# Patient Record
Sex: Female | Born: 1970 | Race: White | Hispanic: No | Smoking: Never smoker
Health system: Southern US, Community
[De-identification: ages and names within clinical notes are randomized; demographics above are authoritative.]

## PROBLEM LIST (undated history)

## (undated) DIAGNOSIS — E119 Type 2 diabetes mellitus without complications: Secondary | ICD-10-CM

## (undated) DIAGNOSIS — K429 Umbilical hernia without obstruction or gangrene: Secondary | ICD-10-CM

## (undated) DIAGNOSIS — J301 Allergic rhinitis due to pollen: Secondary | ICD-10-CM

## (undated) DIAGNOSIS — J45909 Unspecified asthma, uncomplicated: Secondary | ICD-10-CM

## (undated) DIAGNOSIS — E785 Hyperlipidemia, unspecified: Secondary | ICD-10-CM

## (undated) DIAGNOSIS — I10 Essential (primary) hypertension: Secondary | ICD-10-CM

## (undated) DIAGNOSIS — J42 Unspecified chronic bronchitis: Secondary | ICD-10-CM

## (undated) HISTORY — DX: Allergic rhinitis due to pollen: J30.1

## (undated) HISTORY — DX: Unspecified chronic bronchitis: J42

## (undated) HISTORY — DX: Essential (primary) hypertension: I10

## (undated) HISTORY — DX: Type 2 diabetes mellitus without complications: E11.9

## (undated) HISTORY — DX: Unspecified asthma, uncomplicated: J45.909

---

## 2006-09-19 ENCOUNTER — Emergency Department: Payer: Self-pay | Admitting: Emergency Medicine

## 2006-10-02 ENCOUNTER — Emergency Department: Payer: Self-pay | Admitting: Emergency Medicine

## 2007-12-27 ENCOUNTER — Emergency Department: Payer: Self-pay | Admitting: Emergency Medicine

## 2008-03-23 ENCOUNTER — Emergency Department: Payer: Self-pay | Admitting: Emergency Medicine

## 2010-09-13 ENCOUNTER — Emergency Department: Payer: Self-pay | Admitting: Emergency Medicine

## 2013-01-23 ENCOUNTER — Emergency Department: Payer: Self-pay | Admitting: Emergency Medicine

## 2013-07-13 ENCOUNTER — Ambulatory Visit: Payer: Self-pay | Admitting: Family Medicine

## 2013-07-28 ENCOUNTER — Ambulatory Visit: Payer: Self-pay | Admitting: Family Medicine

## 2013-10-02 LAB — HM MAMMOGRAPHY: HM Mammogram: NORMAL

## 2013-11-02 LAB — HM PAP SMEAR: HM Pap smear: NORMAL

## 2014-01-02 LAB — HM DIABETES EYE EXAM

## 2014-10-03 ENCOUNTER — Ambulatory Visit (INDEPENDENT_AMBULATORY_CARE_PROVIDER_SITE_OTHER): Payer: BC Managed Care – PPO | Admitting: Nurse Practitioner

## 2014-10-03 ENCOUNTER — Encounter: Payer: Self-pay | Admitting: Nurse Practitioner

## 2014-10-03 VITALS — BP 130/88 | HR 89 | Temp 98.8°F | Resp 14 | Ht 61.5 in | Wt 207.4 lb

## 2014-10-03 DIAGNOSIS — Z7189 Other specified counseling: Secondary | ICD-10-CM | POA: Diagnosis not present

## 2014-10-03 DIAGNOSIS — J302 Other seasonal allergic rhinitis: Secondary | ICD-10-CM

## 2014-10-03 DIAGNOSIS — J4530 Mild persistent asthma, uncomplicated: Secondary | ICD-10-CM

## 2014-10-03 DIAGNOSIS — I10 Essential (primary) hypertension: Secondary | ICD-10-CM

## 2014-10-03 DIAGNOSIS — Z7689 Persons encountering health services in other specified circumstances: Secondary | ICD-10-CM

## 2014-10-03 DIAGNOSIS — E119 Type 2 diabetes mellitus without complications: Secondary | ICD-10-CM | POA: Diagnosis not present

## 2014-10-03 MED ORDER — CETIRIZINE HCL 10 MG PO CAPS: 1.0000 | ORAL_CAPSULE | Freq: Every day | ORAL | Status: DC

## 2014-10-03 MED ORDER — ALBUTEROL SULFATE HFA 108 (90 BASE) MCG/ACT IN AERS: 2.0000 | INHALATION_SPRAY | Freq: Four times a day (QID) | RESPIRATORY_TRACT | Status: DC | PRN

## 2014-10-03 MED ORDER — METFORMIN HCL ER 500 MG PO TB24: 500.0000 mg | ORAL_TABLET | Freq: Every day | ORAL | Status: DC

## 2014-10-03 MED ORDER — HYDROCHLOROTHIAZIDE 25 MG PO TABS: 25.0000 mg | ORAL_TABLET | Freq: Every day | ORAL | Status: DC

## 2014-10-03 NOTE — Progress Notes (Signed)
Subjective:    Patient ID: Whitney Palmer, female    DOB: 09-Nov-1970, 44 y.o.   MRN: [id number]  HPI  Whitney Palmer is a 44 yo female establishing care today and CC of asthma.   1) New pt info:   Immunizations- tdap 2014, unsure about pneumonia vaccination   Mammogram- 8/15  Pap- 8/15 Not keeping OB/GYN at [facility]   Eye Exam- 12/2013   Dental Exam- Not UTD   2) Chronic Problems-  Asthma- exacerbations in the summer, inhaler used in heat, around cats and cigarette smoke  Diabetes type II- Stopped eating bread, potatoes, soda ect... Lost 38 lbs    Diagnosed in 2014, gained it back since then. 500 mg metformin once daily  Chronic Bronchitis- had pneumonia in 2014, gets sick fast   Seasonal Allergies- Cats and cigarettes   Sleep study in 2013 or 2014- no OSA noted, pt reports large tonsils   3) Acute Problems-  Needs HCTZ Beginning of this year last labs    Review of Systems  Constitutional: Negative for fever, chills, diaphoresis and fatigue.  Respiratory: Negative for chest tightness, shortness of breath and wheezing.   Cardiovascular: Negative for chest pain, palpitations and leg swelling.  Gastrointestinal: Negative for nausea, vomiting and diarrhea.  Endocrine: Positive for polyphagia. Negative for polydipsia and polyuria.  Skin: Negative for rash.  Neurological: Negative for dizziness, weakness, numbness and headaches.  Psychiatric/Behavioral: The patient is not nervous/anxious.    Past Medical History  Diagnosis Date  . Asthma   . Diabetes mellitus without complication   . Chronic bronchitis   . Hay fever   . Hypertension     History   Social History  . Marital Status: Single    Spouse Name: N/A  . Number of Children: N/A  . Years of Education: N/A   Occupational History  . Not on file.   Social History Main Topics  . Smoking status: Never Smoker   . Smokeless tobacco: Not on file  . Alcohol Use: No  . Drug Use: No  . Sexual Activity:   Partners: Male     Comment: Fiance   Other Topics Concern  . 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 No pertinent past surgical history.  Family History  Problem Relation Age of Onset  . Hypertension Father     Not on File  No current outpatient prescriptions on file prior to visit.   No current facility-administered medications on file prior to visit.      Objective:   Physical Exam  Constitutional: Whitney Palmer is oriented to person, place, and time. Whitney Palmer appears well-developed and well-nourished. No distress.  BP 130/88 mmHg  Pulse 89  Temp(Src) 98.8 F (37.1 C)  Resp 14  Ht 5' 1.5" (1.562 m)  Wt 207 lb 6.4 oz (94.076 kg)  BMI 38.56 kg/m2  SpO2 97%   HENT:  Head: Normocephalic and atraumatic.  Right Ear: External ear normal.  Left Ear: External ear normal.  Cardiovascular: Normal rate, regular rhythm, normal heart sounds and intact distal pulses.  Exam reveals no gallop and no friction rub.   No murmur heard. Pulmonary/Chest: Effort normal and breath sounds normal. No respiratory distress.  Whitney Palmer has no wheezes. Whitney Palmer has no rales. Whitney Palmer exhibits no tenderness.  Neurological: Whitney Palmer is alert and oriented to person, place, and time. No cranial nerve deficit. Whitney Palmer exhibits normal muscle tone. Coordination normal.  Skin: Skin is warm and dry. No rash noted. Whitney Palmer is not diaphoretic.  Psychiatric: Whitney Palmer has a normal mood and affect. Her behavior is normal. Judgment and thought content normal.      Assessment & Plan:

## 2014-10-03 NOTE — Progress Notes (Signed)
Pre visit review using our clinic review tool, if applicable. No additional management support is needed unless otherwise documented below in the visit note. 

## 2014-10-03 NOTE — Patient Instructions (Addendum)
Follow up in 3 months for weight loss.

## 2014-10-13 DIAGNOSIS — I10 Essential (primary) hypertension: Secondary | ICD-10-CM | POA: Insufficient documentation

## 2014-10-13 DIAGNOSIS — J302 Other seasonal allergic rhinitis: Secondary | ICD-10-CM | POA: Insufficient documentation

## 2014-10-13 DIAGNOSIS — Z7689 Persons encountering health services in other specified circumstances: Secondary | ICD-10-CM | POA: Insufficient documentation

## 2014-10-13 DIAGNOSIS — E119 Type 2 diabetes mellitus without complications: Secondary | ICD-10-CM | POA: Insufficient documentation

## 2014-10-13 DIAGNOSIS — J45909 Unspecified asthma, uncomplicated: Secondary | ICD-10-CM | POA: Insufficient documentation

## 2014-10-13 NOTE — Assessment & Plan Note (Signed)
Stable. Pt report she does get exacerbations in the summer and bronchitis fairly easily. Stable on albuterol.

## 2014-10-13 NOTE — Assessment & Plan Note (Signed)
Will obtain records for latest labs.  BP Readings from Last 3 Encounters:  10/03/14 130/88   Will refill HCTZ today.

## 2014-10-13 NOTE — Assessment & Plan Note (Signed)
Discussed acute and chronic issues. Reviewed health maintenance measures, PFSHx, and immunizations. Obtain records from previous facility.   

## 2014-10-13 NOTE — Assessment & Plan Note (Signed)
Stable. Refilled metformin. Pt gained back weight she lost previously and is taking metformin 500 mg once daily XR tablet. She had labs at the beginning of this year. Will obtain records.

## 2014-11-02 ENCOUNTER — Encounter: Payer: Self-pay | Admitting: Nurse Practitioner

## 2014-11-14 ENCOUNTER — Encounter: Payer: Self-pay | Admitting: Nurse Practitioner

## 2014-11-14 ENCOUNTER — Ambulatory Visit (INDEPENDENT_AMBULATORY_CARE_PROVIDER_SITE_OTHER): Payer: BC Managed Care – PPO | Admitting: Nurse Practitioner

## 2014-11-14 VITALS — BP 130/81 | HR 71 | Temp 98.6°F | Resp 14 | Ht 65.5 in | Wt 208.2 lb

## 2014-11-14 DIAGNOSIS — J029 Acute pharyngitis, unspecified: Secondary | ICD-10-CM | POA: Insufficient documentation

## 2014-11-14 LAB — POCT RAPID STREP A (OFFICE): RAPID STREP A SCREEN: NEGATIVE

## 2014-11-14 MED ORDER — AZITHROMYCIN 250 MG PO TABS: ORAL_TABLET | ORAL | Status: DC

## 2014-11-14 NOTE — Progress Notes (Signed)
Pre visit review using our clinic review tool, if applicable. No additional management support is needed unless otherwise documented below in the visit note. 

## 2014-11-14 NOTE — Assessment & Plan Note (Signed)
Worsening 1 day. Patient was advised to try conservative therapy at home first. Patient reports she will try cold and flu medication first. Patient encouraged to drink non-caffeinated fluids. Z-Pak sent to pharmacy and encourage patient to wait until fever of 100.5 or worsening sore throat. Probiotics encouraged if patient decides to take Z-Pak. FU prn worsening/failure to improve.

## 2014-11-14 NOTE — Progress Notes (Signed)
Patient ID: Whitney Palmer, female    DOB: 10-30-1970  Age: 44 y.o. MRN: [id number]  CC: Sore Throat   HPI Tylasia Fletchall Tall presents for sore throat x 1 day.  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 Ear on right is also bothering her. Patient reports last time she had the symptoms have progressed quickly and she was out for 6 weeks.   History Whitney Palmer has a past medical history of Asthma; Diabetes mellitus without complication; Chronic bronchitis; Hay fever; and Hypertension.   She has no past surgical history on file.   Her family history includes Hypertension in her father.She reports that she has never smoked. She does not have any smokeless tobacco history on file. She reports that she does not drink alcohol or use illicit drugs.  Outpatient Prescriptions Prior to Visit  Medication Sig Dispense Refill  . albuterol (PROVENTIL HFA;VENTOLIN HFA) 108 (90 BASE) MCG/ACT inhaler Inhale 2 puffs into the lungs every 6 (six) hours as needed for wheezing or shortness of breath. 1 Inhaler 2  . Cetirizine HCl 10 MG CAPS Take 1 capsule (10 mg total) by mouth daily. 90 capsule 2  . hydrochlorothiazide (HYDRODIURIL) 25 MG tablet Take 1 tablet (25 mg total) by mouth daily. 90 tablet 2  . metFORMIN (GLUCOPHAGE-XR) 500 MG 24 hr tablet Take 1 tablet (500 mg total) by mouth daily. 30 tablet 0   No facility-administered medications prior to visit.    ROS Review of Systems  Constitutional: Negative for fever, chills, diaphoresis and fatigue.  HENT: Positive for sore throat. Negative for congestion, dental problem, drooling, ear discharge, ear pain, facial swelling, hearing loss, mouth sores, nosebleeds, postnasal drip, rhinorrhea, sinus pressure, sneezing, tinnitus, trouble swallowing and voice change.   Respiratory: Negative for chest tightness, shortness of breath and wheezing.    Cardiovascular: Negative for chest pain, palpitations and leg swelling.  Gastrointestinal: Negative for nausea, vomiting and diarrhea.  Skin: Negative for rash.  Neurological: Negative for dizziness, weakness, numbness and headaches.  Psychiatric/Behavioral: The patient is not nervous/anxious.     Objective:  BP 130/81 mmHg  Pulse 71  Temp(Src) 98.6 F (37 C)  Resp 14  Ht 5' 5.5" (1.664 m)  Wt 208 lb 3.2 oz (94.439 kg)  BMI 34.11 kg/m2  SpO2 98%  Physical Exam  Constitutional: She is oriented to person, place, and time. She appears well-developed and well-nourished. No distress.  HENT:  Head: Normocephalic and atraumatic.  Right Ear: External ear normal.  Left Ear: External ear normal.  Tympanic membrane clear on right and blocked by cerumen on left  Eyes: EOM are normal. Pupils are equal, round, and reactive to light. Right eye exhibits no discharge. Left eye exhibits no discharge. No scleral icterus.  Neck: Normal range of motion. Neck supple.  Cardiovascular: Normal rate, regular rhythm and normal heart sounds.  Exam reveals no gallop and no friction rub.   No murmur heard. Pulmonary/Chest: Effort normal and breath sounds normal. No respiratory distress. She has no wheezes. She has no rales. She exhibits no tenderness.  Lymphadenopathy:    She has cervical adenopathy.  Neurological: She is alert and oriented to person, place, and time. No cranial nerve deficit. She exhibits normal muscle tone. Coordination normal.  Skin: Skin is warm and dry. No rash noted. She is not diaphoretic.  Psychiatric: She has a normal mood and affect. Her  behavior is normal. Judgment and thought content normal.    Assessment & Plan:   Denis was seen today for sore throat.  Diagnoses and all orders for this visit:  Sore throat -     POCT rapid strep A  Other orders -     azithromycin (ZITHROMAX) 250 MG tablet; Take 2 tablets by mouth on day 1, take 1 tablet by mouth each day after for 4  days.   I am having [name] start on azithromycin. I am also having her maintain her metFORMIN, hydrochlorothiazide, Cetirizine HCl, and albuterol.  Meds ordered this encounter  Medications  . azithromycin (ZITHROMAX) 250 MG tablet    Sig: Take 2 tablets by mouth on day 1, take 1 tablet by mouth each day after for 4 days.    Dispense:  6 each    Refill:  0    Order Specific Question:  Supervising Provider    Answer:  Sherlene Shams [[id number]]     Follow-up: Return if symptoms worsen or fail to improve.

## 2014-11-16 ENCOUNTER — Encounter: Payer: Self-pay | Admitting: Nurse Practitioner

## 2014-12-06 ENCOUNTER — Encounter: Payer: Self-pay | Admitting: Nurse Practitioner

## 2015-01-03 ENCOUNTER — Ambulatory Visit: Payer: BC Managed Care – PPO | Admitting: Nurse Practitioner

## 2015-01-28 ENCOUNTER — Ambulatory Visit (INDEPENDENT_AMBULATORY_CARE_PROVIDER_SITE_OTHER): Payer: BC Managed Care – PPO | Admitting: Nurse Practitioner

## 2015-01-28 ENCOUNTER — Encounter: Payer: Self-pay | Admitting: Nurse Practitioner

## 2015-01-28 VITALS — BP 110/78 | HR 85 | Temp 98.3°F | Resp 14 | Ht 66.0 in | Wt 200.4 lb

## 2015-01-28 DIAGNOSIS — E119 Type 2 diabetes mellitus without complications: Secondary | ICD-10-CM | POA: Diagnosis not present

## 2015-01-28 DIAGNOSIS — J4530 Mild persistent asthma, uncomplicated: Secondary | ICD-10-CM

## 2015-01-28 LAB — CBC WITH DIFFERENTIAL/PLATELET
BASOS ABS: 0 10*3/uL (ref 0.0–0.1)
Basophils Relative: 0.6 % (ref 0.0–3.0)
EOS ABS: 0.1 10*3/uL (ref 0.0–0.7)
Eosinophils Relative: 1.8 % (ref 0.0–5.0)
HCT: 42.9 % (ref 36.0–46.0)
Hemoglobin: 14.6 g/dL (ref 12.0–15.0)
LYMPHS ABS: 2.1 10*3/uL (ref 0.7–4.0)
Lymphocytes Relative: 40 % (ref 12.0–46.0)
MCHC: 33.9 g/dL (ref 30.0–36.0)
MCV: 87.5 fl (ref 78.0–100.0)
MONOS PCT: 5.3 % (ref 3.0–12.0)
Monocytes Absolute: 0.3 10*3/uL (ref 0.1–1.0)
NEUTROS ABS: 2.8 10*3/uL (ref 1.4–7.7)
NEUTROS PCT: 52.3 % (ref 43.0–77.0)
PLATELETS: 282 10*3/uL (ref 150.0–400.0)
RBC: 4.9 Mil/uL (ref 3.87–5.11)
RDW: 12.1 % (ref 11.5–15.5)
WBC: 5.3 10*3/uL (ref 4.0–10.5)

## 2015-01-28 LAB — COMPREHENSIVE METABOLIC PANEL
ALK PHOS: 62 U/L (ref 39–117)
ALT: 32 U/L (ref 0–35)
AST: 19 U/L (ref 0–37)
Albumin: 4.1 g/dL (ref 3.5–5.2)
BILIRUBIN TOTAL: 0.6 mg/dL (ref 0.2–1.2)
BUN: 13 mg/dL (ref 6–23)
CO2: 28 meq/L (ref 19–32)
CREATININE: 0.63 mg/dL (ref 0.40–1.20)
Calcium: 9.6 mg/dL (ref 8.4–10.5)
Chloride: 100 mEq/L (ref 96–112)
GFR: 109.77 mL/min (ref >60.00)
GLUCOSE: 162 mg/dL — AB (ref 70–99)
Potassium: 4.2 mEq/L (ref 3.5–5.1)
SODIUM: 137 meq/L (ref 135–145)
TOTAL PROTEIN: 7.2 g/dL (ref 6.0–8.3)

## 2015-01-28 LAB — LIPID PANEL
Cholesterol: 210 mg/dL — ABNORMAL HIGH (ref 0–200)
HDL: 34.9 mg/dL — AB (ref >39.00)
NONHDL: 175.35
Total CHOL/HDL Ratio: 6
Triglycerides: 243 mg/dL — ABNORMAL HIGH (ref 0.0–149.0)
VLDL: 48.6 mg/dL — ABNORMAL HIGH (ref 0.0–40.0)

## 2015-01-28 LAB — HEMOGLOBIN A1C: HEMOGLOBIN A1C: 7.1 % — AB (ref 4.6–6.5)

## 2015-01-28 LAB — LDL CHOLESTEROL, DIRECT: LDL DIRECT: 143 mg/dL

## 2015-01-28 MED ORDER — METFORMIN HCL 500 MG PO TABS: 1000.0000 mg | ORAL_TABLET | Freq: Two times a day (BID) | ORAL | Status: DC

## 2015-01-28 MED ORDER — ALBUTEROL SULFATE HFA 108 (90 BASE) MCG/ACT IN AERS: 2.0000 | INHALATION_SPRAY | Freq: Four times a day (QID) | RESPIRATORY_TRACT | Status: AC | PRN

## 2015-01-28 NOTE — Assessment & Plan Note (Signed)
Stable. Pt lost 8 lbs and making diet and exercise changes. I have congratulated her on this. Changed Metformin to twice daily instead of XL version 500 mg. Pt instructed on taking 1 w/ breakfast and 1 with dinner.   Pt fasting today will obtain labs.  Foot exam completed Eye exam upcoming  Flu vaccine through work  Pneumovax today

## 2015-01-28 NOTE — Assessment & Plan Note (Signed)
Albuterol inhaler sent to pharmacy. Pt not wheezing today. Recently completed z-pack through walk-in clinic.

## 2015-01-28 NOTE — Patient Instructions (Addendum)
We will send results via MyChart.   Thank you for getting your pneumonia, flu vaccines, and eye exams completed this month!   Take 1 metformin w/ breakfast and the other with dinner.   Follow up in 2 months. Keep up the great work!

## 2015-01-28 NOTE — Progress Notes (Signed)
Pre visit review using our clinic review tool, if applicable. No additional management support is needed unless otherwise documented below in the visit note. 

## 2015-01-28 NOTE — Progress Notes (Signed)
Patient ID: Whitney Palmer, female    DOB: 08-06-1970  Age: 44 y.o. MRN: [id number]  CC: Follow-up   HPI Whitney Palmer presents for follow up on diabetes and needs refills.   1) Needs refill on Metformin and Albuterol inhaler.  Diet- cutting down on carbs and on sweets. 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  History [name] has a past medical history of Asthma; Diabetes mellitus without complication (HCC); Chronic bronchitis (HCC); Hay fever; and Hypertension.   She has no past surgical history on file.   Her family history includes Hypertension in her father.She reports that she has never smoked. She does not have any smokeless tobacco history on file. She reports that she does not drink alcohol or use illicit drugs.  Outpatient Prescriptions Prior to Visit  Medication Sig Dispense Refill  . Cetirizine HCl 10 MG CAPS Take 1 capsule (10 mg total) by mouth daily. 90 capsule 2  . hydrochlorothiazide (HYDRODIURIL) 25 MG tablet Take 1 tablet (25 mg total) by mouth daily. 90 tablet 2  . albuterol (PROVENTIL HFA;VENTOLIN HFA) 108 (90 BASE) MCG/ACT inhaler Inhale 2 puffs into the lungs every 6 (six) hours as needed for wheezing or shortness of breath. 1 Inhaler 2  . azithromycin (ZITHROMAX) 250 MG tablet Take 2 tablets by mouth on day 1, take 1 tablet by mouth each day after for 4 days. 6 each 0  . metFORMIN (GLUCOPHAGE-XR) 500 MG 24 hr tablet Take 1 tablet (500 mg total) by mouth daily. 30 tablet 0   No facility-administered medications prior to visit.    ROS Review of Systems  Constitutional: Negative for fever, chills, diaphoresis and fatigue.  HENT: Negative for sore throat, tinnitus and trouble swallowing.   Eyes: Negative for visual disturbance.  Respiratory: Negative for chest  tightness, shortness of breath and wheezing.   Cardiovascular: Negative for chest pain, palpitations and leg swelling.  Gastrointestinal: Negative for nausea, vomiting and diarrhea.  Endocrine: Positive for polyphagia. Negative for polydipsia and polyuria.       Feeling hungry often  Skin: Negative for rash.  Neurological: Negative for dizziness, weakness, numbness and headaches.  Psychiatric/Behavioral: The patient is not nervous/anxious.     Objective:  BP 110/78 mmHg  Pulse 85  Temp(Src) 98.3 F (36.8 C)  Resp 14  Ht  (1.676 m)  Wt 200 lb 6.4 oz (90.901 kg)  BMI 32.36 kg/m2  SpO2 95%  LMP 01/14/2015  Physical Exam  Constitutional: She is oriented to person, place, and time. She appears well-developed and well-nourished. No distress.  HENT:  Head: Normocephalic and atraumatic.  Right Ear: External ear normal.  Left Ear: External ear normal.  Eyes: EOM are normal. Pupils are equal, round, and reactive to light. Right eye exhibits no discharge. Left eye exhibits no discharge. No scleral icterus.  Cardiovascular: Normal rate, regular rhythm, normal heart sounds and intact distal pulses.  Exam reveals no gallop and no friction rub.   No murmur heard. Pulmonary/Chest: Effort normal and breath sounds normal. No respiratory distress. She has no wheezes. She has no rales. She exhibits no tenderness.  Neurological: She is alert and oriented to person, place, and time. No cranial nerve deficit. She exhibits normal muscle tone. Coordination normal.  Monofilament normal  Skin: Skin is  warm and dry. No rash noted. She is not diaphoretic.  Psychiatric: She has a normal mood and affect. Her behavior is normal. Judgment and thought content normal.   Assessment & Plan:   Whitney Palmer was seen today for follow-up.  Diagnoses and all orders for this visit:  Type 2 diabetes mellitus without complication, without long-term current use of insulin (HCC) -     Hemoglobin A1c -     CBC with  Differential/Platelet -     Comprehensive metabolic panel -     Microalbumin, urine -     Lipid Profile  Asthma, chronic, mild persistent, uncomplicated  Other orders -     albuterol (PROVENTIL HFA;VENTOLIN HFA) 108 (90 BASE) MCG/ACT inhaler; Inhale 2 puffs into the lungs every 6 (six) hours as needed for wheezing or shortness of breath. -     metFORMIN (GLUCOPHAGE) 500 MG tablet; Take 2 tablets (1,000 mg total) by mouth 2 (two) times daily with a meal.   I have discontinued Ms. Finfrock's metFORMIN and azithromycin. I am also having her start on metFORMIN. Additionally, I am having her maintain her hydrochlorothiazide, Cetirizine HCl, and albuterol.  Meds ordered this encounter  Medications  . albuterol (PROVENTIL HFA;VENTOLIN HFA) 108 (90 BASE) MCG/ACT inhaler    Sig: Inhale 2 puffs into the lungs every 6 (six) hours as needed for wheezing or shortness of breath.    Dispense:  1 Inhaler    Refill:  2    Order Specific Question:  Supervising Provider    Answer:  Darrick HuntsmanULLO, TERESA L [[id number]]  . metFORMIN (GLUCOPHAGE) 500 MG tablet    Sig: Take 2 tablets (1,000 mg total) by mouth 2 (two) times daily with a meal.    Dispense:  60 tablet    Refill:  1    Order Specific Question:  Supervising Provider    Answer:  Sherlene ShamsULLO, TERESA L [[id number]]     Follow-up: Return in about 2 months (around 03/30/2015) for DM and asthma check .

## 2015-01-29 ENCOUNTER — Other Ambulatory Visit: Payer: Self-pay | Admitting: Nurse Practitioner

## 2015-01-29 ENCOUNTER — Encounter: Payer: Self-pay | Admitting: Nurse Practitioner

## 2015-01-29 LAB — MICROALBUMIN, URINE: Microalb, Ur: 3.5 mg/dL

## 2015-01-29 MED ORDER — SIMVASTATIN 20 MG PO TABS: 20.0000 mg | ORAL_TABLET | Freq: Every day | ORAL | Status: DC

## 2015-01-31 ENCOUNTER — Other Ambulatory Visit: Payer: Self-pay | Admitting: Nurse Practitioner

## 2015-01-31 ENCOUNTER — Encounter: Payer: Self-pay | Admitting: Nurse Practitioner

## 2015-01-31 ENCOUNTER — Other Ambulatory Visit: Payer: Self-pay

## 2015-01-31 MED ORDER — GLUCOSE BLOOD VI STRP: ORAL_STRIP | Status: DC

## 2015-03-04 ENCOUNTER — Encounter: Payer: Self-pay | Admitting: Nurse Practitioner

## 2015-03-17 ENCOUNTER — Encounter: Payer: Self-pay | Admitting: Nurse Practitioner

## 2015-03-22 ENCOUNTER — Ambulatory Visit (INDEPENDENT_AMBULATORY_CARE_PROVIDER_SITE_OTHER): Payer: BC Managed Care – PPO | Admitting: Nurse Practitioner

## 2015-03-22 ENCOUNTER — Encounter: Payer: Self-pay | Admitting: Nurse Practitioner

## 2015-03-22 VITALS — BP 114/72 | HR 96 | Temp 98.7°F | Ht 60.0 in | Wt 201.0 lb

## 2015-03-22 DIAGNOSIS — J029 Acute pharyngitis, unspecified: Secondary | ICD-10-CM | POA: Diagnosis not present

## 2015-03-22 LAB — POCT RAPID STREP A (OFFICE): RAPID STREP A SCREEN: NEGATIVE — AB

## 2015-03-22 MED ORDER — AMOXICILLIN-POT CLAVULANATE 875-125 MG PO TABS: 1.0000 | ORAL_TABLET | Freq: Two times a day (BID) | ORAL | Status: DC

## 2015-03-22 MED ORDER — CARBAMIDE PEROXIDE 6.5 % OT SOLN: 5.0000 [drp] | Freq: Two times a day (BID) | OTIC | Status: DC

## 2015-03-22 NOTE — Patient Instructions (Signed)
Take generic OTC benadryl 25 mg  At bedtime for the drainage,,  Delsym for daytime cough. flush your sinuses twice daily with Simply Saline   If the coughing has not impoved in  48 hours  OR  if you develop T > 100.4,  Green nasal discharge,  Or facial pain, start the antibiotic.  Please take a probiotic ( Align, Floraque or Culturelle) while you are on the antibiotic to prevent a serious antibiotic associated diarrhea  Called clostridium dificile colitis and a vaginal yeast infection

## 2015-03-22 NOTE — Progress Notes (Signed)
Patient ID: Whitney Palmer, female    DOB: 28-Apr-1970  Age: 44 y.o. MRN: [id number]  CC: Sore Throat   HPI Whitney Palmer presents for CC of sore throat x 2 days.   1) Swollen glands, ears feels stuffy, light green rhinorrhea  Does not want this to progress fully over the holiday.  Tmax- 100.3 Treatment to date: Advil, garggled with warm salt water Advil cold and sinus Cold Eez  Sick contacts- children    History Whitney Palmer has a past medical history of Asthma; Diabetes mellitus without complication (HCC); Chronic bronchitis (HCC); Hay fever; and Hypertension.   Whitney Palmer has no past surgical history on file.   Her family history includes Hypertension in her father.Whitney Palmer reports that Whitney Palmer has never smoked. Whitney Palmer does not have any smokeless tobacco history on file. Whitney Palmer reports that Whitney Palmer does not drink alcohol or use illicit drugs.  Outpatient Prescriptions Prior to Visit  Medication Sig Dispense Refill  . albuterol (PROVENTIL HFA;VENTOLIN HFA) 108 (90 BASE) MCG/ACT inhaler Inhale 2 puffs into the lungs every 6 (six) hours as needed for wheezing or shortness of breath. 1 Inhaler 2  . Cetirizine HCl 10 MG CAPS Take 1 capsule (10 mg total) by mouth daily. 90 capsule 2  . glucose blood test strip Use as instructed 50 each 11  . hydrochlorothiazide (HYDRODIURIL) 25 MG tablet Take 1 tablet (25 mg total) by mouth daily. 90 tablet 2  . metFORMIN (GLUCOPHAGE) 500 MG tablet Take 2 tablets (1,000 mg total) by mouth 2 (two) times daily with a meal. 60 tablet 1  . simvastatin (ZOCOR) 20 MG tablet Take 1 tablet (20 mg total) by mouth at bedtime. 30 tablet 1   No facility-administered medications prior to visit.    ROS Review of Systems  Constitutional: Negative for fever, chills, diaphoresis and fatigue.  Respiratory: Negative for chest tightness, shortness of breath and wheezing.   Cardiovascular: Negative for chest pain, palpitations and leg swelling.  Gastrointestinal: Negative for nausea, vomiting  and diarrhea.  Skin: Negative for rash.  Neurological: Negative for dizziness, weakness, numbness and headaches.  Psychiatric/Behavioral: The patient is not nervous/anxious.     Objective:  BP 114/72 mmHg  Pulse 96  Temp(Src) 98.7 F (37.1 C)  Ht 5' (1.524 m)  Wt 201 lb (91.173 kg)  BMI 39.26 kg/m2  LMP 03/22/2015  Physical Exam  Constitutional: Whitney Palmer is oriented to person, place, and time. Whitney Palmer appears well-developed and well-nourished. No distress.  HENT:  Head: Normocephalic and atraumatic.  Right Ear: External ear normal.  Left Ear: External ear normal.  Large tonsils, no exudates, no erythema, clear drainage TM's clear on right, blocked by cerumen on left  Eyes: EOM are normal. Pupils are equal, round, and reactive to light. Right eye exhibits no discharge. Left eye exhibits no discharge. No scleral icterus.  Neck: Normal range of motion. Neck supple.  Cardiovascular: Normal rate, regular rhythm and normal heart sounds.  Exam reveals no gallop and no friction rub.   No murmur heard. Pulmonary/Chest: Effort normal and breath sounds normal. No respiratory distress. Whitney Palmer has no wheezes. Whitney Palmer has no rales. Whitney Palmer exhibits no tenderness.  Lymphadenopathy:    Whitney Palmer has cervical adenopathy.  Neurological: Whitney Palmer is alert and oriented to person, place, and time. No cranial nerve deficit. Whitney Palmer exhibits normal muscle tone. Coordination normal.  Skin: Skin is warm and dry. No rash noted. Whitney Palmer is not diaphoretic.  Psychiatric: Whitney Palmer has a normal mood and affect. Her behavior is normal. Judgment and  thought content normal.   Assessment & Plan:   Whitney Palmer was seen today for sore throat.  Diagnoses and all orders for this visit:  Sore throat -     POCT rapid strep A  Other orders -     amoxicillin-clavulanate (AUGMENTIN) 875-125 MG tablet; Take 1 tablet by mouth 2 (two) times daily. -     carbamide peroxide (DEBROX) 6.5 % otic solution; Place 5 drops into both ears 2 (two) times daily.   I am  having Whitney Palmer start on amoxicillin-clavulanate and carbamide peroxide. I am also having her maintain her hydrochlorothiazide, Cetirizine HCl, albuterol, metFORMIN, simvastatin, and glucose blood.  Meds ordered this encounter  Medications  . amoxicillin-clavulanate (AUGMENTIN) 875-125 MG tablet    Sig: Take 1 tablet by mouth 2 (two) times daily.    Dispense:  14 tablet    Refill:  0    Order Specific Question:  Supervising Provider    Answer:  Duncan DullULLO, TERESA L [[id number]]  . carbamide peroxide (DEBROX) 6.5 % otic solution    Sig: Place 5 drops into both ears 2 (two) times daily.    Dispense:  15 mL    Refill:  0    Order Specific Question:  Supervising Provider    Answer:  Sherlene ShamsULLO, TERESA L [[id number]]     Follow-up: Return if symptoms worsen or fail to improve.

## 2015-03-22 NOTE — Assessment & Plan Note (Signed)
POCT rapid strep negative. Debrox recommended for removal of wax in left ear. Augmentin sent to pharmacy to pick up. Asked pt not to take until fever of 100.4, facial pain, sore throat progresses despite otc measures.

## 2015-03-25 ENCOUNTER — Encounter: Payer: Self-pay | Admitting: Nurse Practitioner

## 2015-07-10 ENCOUNTER — Encounter: Payer: Self-pay | Admitting: Nurse Practitioner

## 2015-07-11 ENCOUNTER — Other Ambulatory Visit: Payer: Self-pay | Admitting: Nurse Practitioner

## 2015-07-11 ENCOUNTER — Encounter: Payer: Self-pay | Admitting: Nurse Practitioner

## 2015-07-11 MED ORDER — PREDNISONE 10 MG PO TABS: ORAL_TABLET | ORAL | Status: DC

## 2015-07-23 ENCOUNTER — Encounter: Payer: Self-pay | Admitting: Nurse Practitioner

## 2015-09-17 ENCOUNTER — Encounter: Payer: Self-pay | Admitting: Internal Medicine

## 2015-09-17 ENCOUNTER — Ambulatory Visit (INDEPENDENT_AMBULATORY_CARE_PROVIDER_SITE_OTHER): Payer: BC Managed Care – PPO | Admitting: Internal Medicine

## 2015-09-17 VITALS — BP 122/80 | HR 82 | Temp 98.8°F | Ht 60.0 in | Wt 196.0 lb

## 2015-09-17 DIAGNOSIS — I10 Essential (primary) hypertension: Secondary | ICD-10-CM

## 2015-09-17 DIAGNOSIS — E785 Hyperlipidemia, unspecified: Secondary | ICD-10-CM | POA: Insufficient documentation

## 2015-09-17 DIAGNOSIS — J4520 Mild intermittent asthma, uncomplicated: Secondary | ICD-10-CM

## 2015-09-17 DIAGNOSIS — E119 Type 2 diabetes mellitus without complications: Secondary | ICD-10-CM

## 2015-09-17 LAB — CBC
HEMATOCRIT: 41.7 % (ref 36.0–46.0)
HEMOGLOBIN: 14.3 g/dL (ref 12.0–15.0)
MCHC: 34.3 g/dL (ref 30.0–36.0)
MCV: 86.9 fl (ref 78.0–100.0)
PLATELETS: 299 10*3/uL (ref 150.0–400.0)
RBC: 4.79 Mil/uL (ref 3.87–5.11)
RDW: 12.9 % (ref 11.5–15.5)
WBC: 7.1 10*3/uL (ref 4.0–10.5)

## 2015-09-17 LAB — LDL CHOLESTEROL, DIRECT: Direct LDL: 118 mg/dL

## 2015-09-17 LAB — COMPREHENSIVE METABOLIC PANEL
ALT: 30 U/L (ref 0–35)
AST: 17 U/L (ref 0–37)
Albumin: 4 g/dL (ref 3.5–5.2)
Alkaline Phosphatase: 65 U/L (ref 39–117)
BUN: 11 mg/dL (ref 6–23)
CALCIUM: 9.8 mg/dL (ref 8.4–10.5)
CHLORIDE: 99 meq/L (ref 96–112)
CO2: 30 meq/L (ref 19–32)
Creatinine, Ser: 0.62 mg/dL (ref 0.40–1.20)
GFR: 111.48 mL/min (ref >60.00)
Glucose, Bld: 195 mg/dL — ABNORMAL HIGH (ref 70–99)
POTASSIUM: 4.1 meq/L (ref 3.5–5.1)
Sodium: 134 mEq/L — ABNORMAL LOW (ref 135–145)
Total Bilirubin: 0.5 mg/dL (ref 0.2–1.2)
Total Protein: 7.2 g/dL (ref 6.0–8.3)

## 2015-09-17 LAB — MICROALBUMIN / CREATININE URINE RATIO
Creatinine,U: 149.1 mg/dL
MICROALB UR: 2.1 mg/dL — AB (ref 0.0–1.9)
Microalb Creat Ratio: 1.4 mg/g (ref 0.0–30.0)

## 2015-09-17 LAB — LIPID PANEL
Cholesterol: 225 mg/dL — ABNORMAL HIGH (ref 0–200)
HDL: 34.5 mg/dL — AB (ref >39.00)
Total CHOL/HDL Ratio: 7

## 2015-09-17 LAB — HEMOGLOBIN A1C: HEMOGLOBIN A1C: 7.9 % — AB (ref 4.6–6.5)

## 2015-09-17 NOTE — Assessment & Plan Note (Signed)
Continue Zyrtec and Albuterol as needed ?

## 2015-09-17 NOTE — Progress Notes (Signed)
HPI  Pt presents to the clinic today to establish care and for management of the conditions listed below. She is transferring care from [facility].  Asthma: Allergy induced. Worse in the spring. She takes Zyrtec and Albuterol only as needed.  DM 2: Her sugars run 100-120 fasting. She stopped taking the Metformin 07/2015, because she just wanted to stop taking it. She gets a yearly eye exam. Her flu and pneumonia vaccine are UTD.  HTN: She is taking the HCTZ as prescribed. Her BP today is 122/80. There is no ECG on file.  HLD: She reports she stopped taking the Simvastatin after 1 month. She is trying to consume a low fat diet.  Flu: 01/2015 Tetanus: 09/2012 Pneumovax: 12/2014 Pap Smear: 10/2013 Mammogram: 09/2013 Vision Screening: yearly Dentist: biannually   Past Medical History  Diagnosis Date  . Asthma   . Diabetes mellitus without complication (HCC)   . Chronic bronchitis (HCC)   . Hay fever   . Hypertension     Current Outpatient Prescriptions  Medication Sig Dispense Refill  . albuterol (PROVENTIL HFA;VENTOLIN HFA) 108 (90 BASE) MCG/ACT inhaler Inhale 2 puffs into the lungs every 6 (six) hours as needed for wheezing or shortness of breath. 1 Inhaler 2  . carbamide peroxide (DEBROX) 6.5 % otic solution Place 5 drops into both ears 2 (two) times daily. 15 mL 0  . Cetirizine HCl 10 MG CAPS Take 1 capsule (10 mg total) by mouth daily. 90 capsule 2  . glucose blood test strip Use as instructed 50 each 11  . hydrochlorothiazide (HYDRODIURIL) 25 MG tablet Take 1 tablet (25 mg total) by mouth daily. 90 tablet 2  . metFORMIN (GLUCOPHAGE) 500 MG tablet Take 2 tablets (1,000 mg total) by mouth 2 (two) times daily with a meal. 60 tablet 1  . simvastatin (ZOCOR) 20 MG tablet Take 1 tablet (20 mg total) by mouth at bedtime. (Patient not taking: Reported on 09/17/2015) 30 tablet 1   No current facility-administered medications for this visit.    No Known Allergies  Family History   Problem Relation Age of Onset  . Hypertension Father     Social History   Social History  . Marital Status: Single    Spouse Name: N/A  . Number of Children: N/A  . Years of Education: N/A   Occupational History  . Not on file.   Social History Main Topics  . Smoking status: Never Smoker   . Smokeless tobacco: Not on file  . Alcohol Use: No  . Drug Use: No  . Sexual Activity:    Partners: Male     Comment: Fiance   Other Topics Concern  . 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  HEENT: Denies eye pain, eye redness, ear pain, ringing in the ears, wax buildup, runny nose, nasal congestion, bloody nose, or sore throat. Respiratory: Denies difficulty breathing, shortness of breath, cough or sputum production.   Cardiovascular: Denies chest pain, chest tightness, palpitations or swelling in the hands or feet.  Neurological: Denies dizziness, difficulty with memory, difficulty with speech or problems with balance and coordination.  Psych: Denies anxiety,  depression, SI/HI.  No other specific complaints in a complete review of systems (except as listed in HPI above).  PE:  BP 122/80 mmHg  Pulse 82  Temp(Src) 98.8 F (37.1 C) (Oral)  Ht 5' (1.524 m)  Wt 196 lb (88.905 kg)  BMI 38.28 kg/m2  SpO2 98%  LMP 09/11/2015 Wt Readings from Last 3 Encounters:  09/17/15 196 lb (88.905 kg)  03/22/15 201 lb (91.173 kg)  01/28/15 200 lb 6.4 oz (90.901 kg)    General: Appears her stated age, obese in NAD. HEENT: Head: normal shape and size; Eyes: sclera white, no icterus, conjunctiva pink, PERRLA and EOMs intact; Throat/Mouth: Teeth present, mucosa pink and moist, no lesions or ulcerations  noted.  Cardiovascular: Normal rate and rhythm. S1,S2 noted.  No murmur, rubs or gallops noted. No JVD or BLE edema.  Pulmonary/Chest: Normal effort and positive vesicular breath sounds. No respiratory distress. No wheezes, rales or ronchi noted.  Neurological: Alert and oriented.  Psychiatric: She is mildly anxious today.  BMET    Component Value Date/Time   NA 137 01/28/2015 0836   K 4.2 01/28/2015 0836   CL 100 01/28/2015 0836   CO2 28 01/28/2015 0836   GLUCOSE 162* 01/28/2015 0836   BUN 13 01/28/2015 0836   CREATININE 0.63 01/28/2015 0836   CALCIUM 9.6 01/28/2015 0836    Lipid Panel     Component Value Date/Time   CHOL 210* 01/28/2015 0836   TRIG 243.0* 01/28/2015 0836   HDL 34.90* 01/28/2015 0836   CHOLHDL 6 01/28/2015 0836   VLDL 48.6* 01/28/2015 0836    CBC    Component Value Date/Time   WBC 5.3 01/28/2015 0836   RBC 4.90 01/28/2015 0836   HGB 14.6 01/28/2015 0836   HCT 42.9 01/28/2015 0836   PLT 282.0 01/28/2015 0836   MCV 87.5 01/28/2015 0836   MCHC 33.9 01/28/2015 0836   RDW 12.1 01/28/2015 0836   LYMPHSABS 2.1 01/28/2015 0836   MONOABS 0.3 01/28/2015 0836   EOSABS 0.1 01/28/2015 0836   BASOSABS 0.0 01/28/2015 0836    Hgb A1C Lab Results  Component Value Date   HGBA1C 7.1* 01/28/2015     Assessment and Plan:

## 2015-09-17 NOTE — Assessment & Plan Note (Signed)
Will check CMET and Lipid Profile today If LDL > 100, will restart Simvastatin Handout given on low fat diet

## 2015-09-17 NOTE — Assessment & Plan Note (Signed)
Controlled on HCTZ ECG with annual exam CBC, CMET today

## 2015-09-17 NOTE — Patient Instructions (Signed)

## 2015-09-17 NOTE — Assessment & Plan Note (Signed)
A1C and microalbumin today Encouraged her to consume a low fat, low carb diet and exercise Immunizations UTD Foot exam today Encouraged yearly eye exams If A1C  >7, will restart Metformin

## 2015-09-18 ENCOUNTER — Encounter: Payer: Self-pay | Admitting: Internal Medicine

## 2015-09-20 ENCOUNTER — Encounter: Payer: Self-pay | Admitting: Internal Medicine

## 2015-09-20 MED ORDER — SIMVASTATIN 20 MG PO TABS: 20.0000 mg | ORAL_TABLET | Freq: Every day | ORAL | Status: DC

## 2015-09-20 MED ORDER — LISINOPRIL 2.5 MG PO TABS: 2.5000 mg | ORAL_TABLET | Freq: Every day | ORAL | Status: DC

## 2015-09-20 MED ORDER — METFORMIN HCL 500 MG PO TABS: 1000.0000 mg | ORAL_TABLET | Freq: Two times a day (BID) | ORAL | Status: DC

## 2015-09-20 NOTE — Addendum Note (Signed)
Addended by: Roena MaladyEVONTENNO, Brissa Asante Y on: 09/20/2015 03:09 PM   Modules accepted: Orders

## 2015-09-20 NOTE — Addendum Note (Signed)
Addended by: Roena MaladyEVONTENNO, Dustyn Armbrister Y on: 09/20/2015 10:51 AM   Modules accepted: Orders

## 2015-09-27 ENCOUNTER — Encounter: Payer: Self-pay | Admitting: Internal Medicine

## 2015-10-09 ENCOUNTER — Encounter: Payer: Self-pay | Admitting: Internal Medicine

## 2015-10-09 MED ORDER — HYDROCHLOROTHIAZIDE 25 MG PO TABS: 25.0000 mg | ORAL_TABLET | Freq: Every day | ORAL | Status: DC

## 2015-11-01 ENCOUNTER — Encounter: Payer: Self-pay | Admitting: Internal Medicine

## 2015-11-19 ENCOUNTER — Encounter: Payer: Self-pay | Admitting: Internal Medicine

## 2015-12-12 ENCOUNTER — Encounter: Payer: Self-pay | Admitting: Internal Medicine

## 2015-12-12 DIAGNOSIS — E119 Type 2 diabetes mellitus without complications: Secondary | ICD-10-CM

## 2015-12-19 ENCOUNTER — Other Ambulatory Visit: Payer: BC Managed Care – PPO

## 2016-01-13 ENCOUNTER — Encounter: Payer: Self-pay | Admitting: Internal Medicine

## 2016-03-19 ENCOUNTER — Ambulatory Visit: Payer: BC Managed Care – PPO | Admitting: Internal Medicine

## 2016-03-19 ENCOUNTER — Telehealth: Payer: Self-pay | Admitting: Internal Medicine

## 2016-03-19 NOTE — Telephone Encounter (Signed)
Called pt to reschedule appt, mailbox not set up.

## 2016-03-19 NOTE — Telephone Encounter (Signed)
Yes follow up needed, and her appt was made 6 months out, she should have gotten a reminder call from our front office.

## 2016-03-19 NOTE — Telephone Encounter (Signed)
Patient did not come in for their appointment today for 6 mo follow up  Please let me know if patient needs to be contacted immediately for follow up or no follow up needed. °

## 2016-03-19 NOTE — Progress Notes (Deleted)
Subjective:    Patient ID: Whitney NorfolkMarti D Brunetti, female    DOB: 1970/05/01, 45 y.o.   MRN: [id number]  HPI  Pt presents to the clinic today for 6 month follow up of chronic conditions.  HTN: She is taking HCTZ as prescribed. She is on Lisinopril mainly for kidney protection secondary to DM 2. Her BP today is. There is no ECG on file.  Allergy induced asthma: She has an Albuterol inhaler.  DM 2: Her last A1C was 7.9%, 08/2015. She was advised at that time to start taking her Metformin, which she had stopped on her own. She never returned to have her labs checked 11/2015. Her last eye exam. Flu. Pneumovax.  HLD: her last LDL was 118, 08/2015. She was advised at that time to start taking her Zocor, which she had stopped on her own. She never returned to have her labs checked 11/2015.  Review of Systems      Past Medical History:  Diagnosis Date  . Asthma   . Chronic bronchitis (HCC)   . Diabetes mellitus without complication (HCC)   . Hay fever   . Hypertension     Current Outpatient Prescriptions  Medication Sig Dispense Refill  . albuterol (PROVENTIL HFA;VENTOLIN HFA) 108 (90 BASE) MCG/ACT inhaler Inhale 2 puffs into the lungs every 6 (six) hours as needed for wheezing or shortness of breath. 1 Inhaler 2  . carbamide peroxide (DEBROX) 6.5 % otic solution Place 5 drops into both ears 2 (two) times daily. 15 mL 0  . Cetirizine HCl 10 MG CAPS Take 1 capsule (10 mg total) by mouth daily. 90 capsule 2  . glucose blood test strip Use as instructed 50 each 11  . hydrochlorothiazide (HYDRODIURIL) 25 MG tablet Take 1 tablet (25 mg total) by mouth daily. 90 tablet 1  . lisinopril (PRINIVIL,ZESTRIL) 2.5 MG tablet Take 1 tablet (2.5 mg total) by mouth daily. 30 tablet 2  . metFORMIN (GLUCOPHAGE) 500 MG tablet Take 2 tablets (1,000 mg total) by mouth 2 (two) times daily with a meal. 60 tablet 2  . simvastatin (ZOCOR) 20 MG tablet Take 1 tablet (20 mg total) by mouth at bedtime. 30 tablet 2   No  current facility-administered medications for this visit.     No Known Allergies  Family History  Problem Relation Age of Onset  . Hypertension Father   . Diabetes Maternal Uncle   . Cancer Neg Hx   . Stroke Neg Hx   . Hearing loss Neg Hx     Social History   Social History  . Marital status: Single    Spouse name: N/A  . Number of children: N/A  . Years of education: N/A   Occupational History  . Not on file.   Social History Main Topics  . Smoking status: Never Smoker  . Smokeless tobacco: Not on file  . Alcohol use No  . Drug use: No  . Sexual activity: Yes    Partners: Male     Comment: Fiance   Other Topics Concern  . 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  HEENT: Denies eye pain, eye  redness, ear pain, ringing in the ears, wax buildup, runny nose, nasal congestion, bloody nose, or sore throat. Respiratory: Denies difficulty breathing, shortness of breath, cough or sputum production.   Cardiovascular: Denies chest pain, chest tightness, palpitations or swelling in the hands or feet.  Gastrointestinal: Denies abdominal pain, bloating, constipation, diarrhea or blood in the stool.  GU: Denies urgency, frequency, pain with urination, burning sensation, blood in urine, odor or discharge. Musculoskeletal: Denies decrease in range of motion, difficulty with gait, muscle pain or joint pain and swelling.  Skin: Denies redness, rashes, lesions or ulcercations.  Neurological: Denies dizziness, difficulty with memory, difficulty with speech or problems with balance and coordination.  Psych: Denies anxiety, depression, SI/HI.  No other specific complaints in a complete review of systems  (except as listed in HPI above).  Objective:   Physical Exam        Assessment & Plan:

## 2016-03-24 NOTE — Telephone Encounter (Signed)
Pt no longer comes here and states that she canceled the appointment months ago.

## 2018-10-21 ENCOUNTER — Encounter: Payer: BC Managed Care – PPO | Attending: Physician Assistant | Admitting: Dietician

## 2018-10-21 ENCOUNTER — Other Ambulatory Visit: Payer: Self-pay

## 2018-10-21 VITALS — Ht 60.0 in | Wt 191.7 lb

## 2018-10-21 DIAGNOSIS — E119 Type 2 diabetes mellitus without complications: Secondary | ICD-10-CM | POA: Diagnosis not present

## 2018-10-21 NOTE — Patient Instructions (Signed)
   OK to gradually add some carbohydrate foods to meals; aim to consistently have 15-30grams of carb with each meal for several weeks, then can increase to 30-45grams of carb with each meal.  Best plan is to eat a meal or snack every 3-5 hours. Snacks are for preventing low blood sugar and to help meet daily nutrition needs.   Great job making healthy choices and increasing exercise, keep up the good work!

## 2018-10-21 NOTE — Progress Notes (Signed)
Medical Nutrition Therapy: Visit start time: 1330  end time: 1430  Assessment:  Diagnosis: Type 2 diabetes Past medical history: HTN, HLD Psychosocial issues/ stress concerns: patient reports moderate - high stress level  Preferred learning method:  . Auditory-- discussion . Visual   Current weight: 191.7lbs Height: 5'0" Medications, supplements: reconciled list in medical record  Progress and evaluation:   Patient reports weight loss of 50lbs 1-2 years ago, understood she could eat whatever and as much as she wanted after reaching goal, so ended up regaining weight  Saw new MD recently and restarted testing BGs and had elevated results  Activity and eating habits changed/ declined in March 2020 with school (work) closings and COVID restrictions  Patient has made significant diet and lifestyle changes in the past month to reduce carb intake and increase physical activity.   Likes salty snacks ie chips, but stopped keeping in the home; does not care as much for sweets  She has lost 2-3 lbs in the past month with making diet and lifestyle changes.    Physical activity: walking 30-45 minutes (2-3 miles per patient) 6x a week  Dietary Intake:  Usual eating pattern includes 3 meals and 2-3 snacks per day. Dining out frequency: 0-1 meals per week.  Breakfast: 3 sl [place] bacon, 2 boiled eggs; steel cut oats with cinnamon; egg whites with [place] sausage links; egg white omelet with spinach; prefers breakfast sandwich or wrap Breakfast is favorite meal   Snack: apple or banana, 3-4 cheese cubes, sometimes carrots Lunch: orders plate meal from friend baked chicken, green beans, sm poritons potatoes; chicken with cabbage and potatoes or green beans; [place] (crock pot) with squash and zucchini sauteed Snack: likes yogurt for snack or lunch Supper: hamburger patty with salad - salsa as dressing, also loves avocado and guacamole; salmon/ whitefish (not fried) + veg; spaghetti + veg; or same  as lunch -- spouse eats differently, more starch, meat -- he does not like whole grains like brown rice. Snack: sometimes, peanut butter, [place], cheese Beverages: water, black coffee  Nutrition Care Education: Topics covered: diabetes Basic nutrition: basic food groups, appropriate nutrient balance, appropriate meal and snack schedule, general nutrition guidelines    Advanced nutrition: dining out, food label reading Diabetes:  appropriate meal and snack schedule, appropriate carb intake and balance, and healthy carb choices, role of dietary fiber; importance of protein and vegetables; role of physical activity; other factors that affect BGs including sleep, pain, stress, etc.   Nutritional Diagnosis:  Bowler-2.2 Altered nutrition-related laboratory As related to Type 2 Diabetes.  As evidenced by patient with recent HbA1C of 9.5%, working on diet and lifestyle changes to improve BG control and lose weight.  Intervention:   Instruction as noted above.  Patient has already made dietary improvements on her own and has resumed regular exercise.   Established goals to achieve appropriate carbohydrate intake and balance and further improve BG control.   No follow-up scheduled at this time; patient will schedule later if needed.  Education Materials given:  . General diet guidelines for Diabetes . Plate Planner with food lists . Sample menus . Goals/ instructions   Learner/ who was taught:  . Patient   Level of understanding: Marland Kitchen Verbalizes/ demonstrates competency   Demonstrated degree of understanding via:   Teach back Learning barriers: . None  Willingness to learn/ readiness for change: . Eager, change in progress   Monitoring and Evaluation:  Dietary intake, exercise, BG control, and body weight  follow up: prn

## 2019-10-15 ENCOUNTER — Emergency Department
Admission: EM | Admit: 2019-10-15 | Discharge: 2019-10-15 | Disposition: A | Discharge status: Home / Self Care | Payer: BC Managed Care – PPO | Attending: Emergency Medicine | Admitting: Emergency Medicine

## 2019-10-15 ENCOUNTER — Encounter: Payer: Self-pay | Admitting: Emergency Medicine

## 2019-10-15 ENCOUNTER — Other Ambulatory Visit: Payer: Self-pay

## 2019-10-15 ENCOUNTER — Emergency Department: Payer: BC Managed Care – PPO

## 2019-10-15 DIAGNOSIS — J45909 Unspecified asthma, uncomplicated: Secondary | ICD-10-CM | POA: Insufficient documentation

## 2019-10-15 DIAGNOSIS — Z79899 Other long term (current) drug therapy: Secondary | ICD-10-CM | POA: Insufficient documentation

## 2019-10-15 DIAGNOSIS — N132 Hydronephrosis with renal and ureteral calculous obstruction: Secondary | ICD-10-CM | POA: Insufficient documentation

## 2019-10-15 DIAGNOSIS — Z7984 Long term (current) use of oral hypoglycemic drugs: Secondary | ICD-10-CM | POA: Diagnosis not present

## 2019-10-15 DIAGNOSIS — K3580 Unspecified acute appendicitis: Secondary | ICD-10-CM | POA: Insufficient documentation

## 2019-10-15 DIAGNOSIS — I10 Essential (primary) hypertension: Secondary | ICD-10-CM | POA: Insufficient documentation

## 2019-10-15 DIAGNOSIS — E119 Type 2 diabetes mellitus without complications: Secondary | ICD-10-CM | POA: Diagnosis not present

## 2019-10-15 DIAGNOSIS — N200 Calculus of kidney: Secondary | ICD-10-CM

## 2019-10-15 DIAGNOSIS — R1031 Right lower quadrant pain: Secondary | ICD-10-CM | POA: Diagnosis present

## 2019-10-15 LAB — COMPREHENSIVE METABOLIC PANEL
ALT: 34 U/L (ref 0–44)
AST: 19 U/L (ref 15–41)
Albumin: 4.3 g/dL (ref 3.5–5.0)
Alkaline Phosphatase: 41 U/L (ref 38–126)
Anion gap: 13 (ref 5–15)
BUN: 21 mg/dL — ABNORMAL HIGH (ref 6–20)
CO2: 26 mmol/L (ref 22–32)
Calcium: 9.5 mg/dL (ref 8.9–10.3)
Chloride: 99 mmol/L (ref 98–111)
Creatinine, Ser: 0.8 mg/dL (ref 0.44–1.00)
GFR calc Af Amer: >60 mL/min (ref >60)
GFR calc non Af Amer: >60 mL/min (ref >60)
Glucose, Bld: 211 mg/dL — ABNORMAL HIGH (ref 70–99)
Potassium: 4 mmol/L (ref 3.5–5.1)
Sodium: 138 mmol/L (ref 135–145)
Total Bilirubin: 0.5 mg/dL (ref 0.3–1.2)
Total Protein: 7.4 g/dL (ref 6.5–8.1)

## 2019-10-15 LAB — URINALYSIS, COMPLETE (UACMP) WITH MICROSCOPIC
Bacteria, UA: NONE SEEN
Bilirubin Urine: NEGATIVE
Glucose, UA: >=500 mg/dL — AB
Ketones, ur: 5 mg/dL — AB
Leukocytes,Ua: NEGATIVE
Nitrite: NEGATIVE
Protein, ur: NEGATIVE mg/dL
Specific Gravity, Urine: 1.026 (ref 1.005–1.030)
pH: 7 (ref 5.0–8.0)

## 2019-10-15 LAB — CBC
HCT: 40.7 % (ref 36.0–46.0)
Hemoglobin: 14.2 g/dL (ref 12.0–15.0)
MCH: 30.6 pg (ref 26.0–34.0)
MCHC: 34.9 g/dL (ref 30.0–36.0)
MCV: 87.7 fL (ref 80.0–100.0)
Platelets: 288 10*3/uL (ref 150–400)
RBC: 4.64 MIL/uL (ref 3.87–5.11)
RDW: 11.6 % (ref 11.5–15.5)
WBC: 5.3 10*3/uL (ref 4.0–10.5)
nRBC: 0 % (ref 0.0–0.2)

## 2019-10-15 LAB — POCT PREGNANCY, URINE: Preg Test, Ur: NEGATIVE

## 2019-10-15 LAB — GLUCOSE, CAPILLARY: Glucose-Capillary: 239 mg/dL — ABNORMAL HIGH (ref 70–99)

## 2019-10-15 LAB — LIPASE, BLOOD: Lipase: 42 U/L (ref 11–51)

## 2019-10-15 MED ORDER — ONDANSETRON HCL 4 MG/2ML IJ SOLN
4.0000 mg | Freq: Once | INTRAMUSCULAR | Status: AC
  Administered 2019-10-15: 4 mg via INTRAVENOUS
  Filled 2019-10-15: qty 2

## 2019-10-15 MED ORDER — TAMSULOSIN HCL 0.4 MG PO CAPS: 0.4000 mg | ORAL_CAPSULE | Freq: Every day | ORAL | 0 refills | Status: AC

## 2019-10-15 MED ORDER — SODIUM CHLORIDE 0.9 % IV BOLUS
500.0000 mL | Freq: Once | INTRAVENOUS | Status: AC
  Administered 2019-10-15: 500 mL via INTRAVENOUS

## 2019-10-15 MED ORDER — IOHEXOL 300 MG/ML  SOLN
100.0000 mL | Freq: Once | INTRAMUSCULAR | Status: AC | PRN
  Administered 2019-10-15: 100 mL via INTRAVENOUS

## 2019-10-15 MED ORDER — MORPHINE SULFATE (PF) 4 MG/ML IV SOLN
4.0000 mg | Freq: Once | INTRAVENOUS | Status: AC
  Administered 2019-10-15: 4 mg via INTRAVENOUS
  Filled 2019-10-15: qty 1

## 2019-10-15 MED ORDER — OXYCODONE-ACETAMINOPHEN 5-325 MG PO TABS: 1.0000 | ORAL_TABLET | ORAL | 0 refills | Status: AC | PRN

## 2019-10-15 MED ORDER — IBUPROFEN 600 MG PO TABS
600.0000 mg | ORAL_TABLET | Freq: Four times a day (QID) | ORAL | 0 refills | Status: DC | PRN
Start: 2019-10-15 — End: 2023-08-05

## 2019-10-15 NOTE — ED Notes (Signed)
Pt informed screener that she was vomiting. This RN spoke with pt and offered ODT zofran. Pt declined at this time. Pt states that her pain is increasing which is what caused her to vomit. Pt informed we do not have any rooms at this time and that RN will review her labs as they result. Pt informed to notify RN if she changes her mind about ODT Zofran.

## 2019-10-15 NOTE — ED Notes (Signed)
Pt ambulatory to the bathroom without assistance at this time.

## 2019-10-15 NOTE — ED Notes (Signed)
Pt given meal tray.

## 2019-10-15 NOTE — ED Notes (Signed)
Pt states pain is a 3/10 right now. States that she remains nauseous because she is hungry. Waiting to hear from urologist.

## 2019-10-15 NOTE — ED Provider Notes (Signed)
[facility] Emergency Department Provider Note ____________________________________________   First MD Initiated Contact with Patient 10/15/19 [phone]     (approximate)  I have reviewed the triage vital signs and the nursing notes.   HISTORY  Chief Complaint Abdominal Pain    HPI Whitney Palmer is a 49 y.o. female with PMH as noted below who presents with right lower quadrant abdominal pain, acute onset around 530 this morning, persistent course since then,and associated with nausea and vomiting.  It radiates somewhat to the right flank and back.  The patient denies any diarrhea or urinary symptoms.  She has no prior history of this pain.  She has no history of any abdominal surgeries.  Past Medical History:  Diagnosis Date  . Asthma   . Chronic bronchitis (HCC)   . Diabetes mellitus without complication (HCC)   . Hay fever   . Hypertension     Patient Active Problem List   Diagnosis Date Noted  . HLD (hyperlipidemia) 09/17/2015  . Allergy-induced asthma 10/13/2014  . Diabetes mellitus, type II (HCC) 10/13/2014  . Benign essential HTN 10/13/2014    History reviewed. No pertinent surgical history.  Prior to Admission medications   Medication Sig Start Date End Date Taking? Authorizing Provider  albuterol (PROVENTIL HFA;VENTOLIN HFA) 108 (90 BASE) MCG/ACT inhaler Inhale 2 puffs into the lungs every 6 (six) hours as needed for wheezing or shortness of breath. 01/28/15   Carollee Leitz, RN  Cetirizine HCl 10 MG CAPS Take 1 capsule (10 mg total) by mouth daily. Patient taking differently: Take 1 capsule by mouth as needed.  10/03/14   Carollee Leitz, RN  fluticasone (FLONASE) 50 MCG/ACT nasal spray Place into the nose as needed.  07/03/16   [provider]  glucose blood test strip Use as instructed 01/31/15   Carollee Leitz, RN  hydrochlorothiazide (HYDRODIURIL) 25 MG tablet Take 1 tablet (25 mg total) by mouth daily. 10/09/15   Lorre Munroe,  NP  ibuprofen (ADVIL) 600 MG tablet Take 1 tablet (600 mg total) by mouth every 6 (six) hours as needed. 10/15/19   Dionne Bucy, MD  metFORMIN (GLUCOPHAGE-XR) 500 MG 24 hr tablet 500 mg. 1 tab in am and 2 tabs at bedtime 07/28/18   [provider]  oxyCODONE-acetaminophen (PERCOCET) 5-325 MG tablet Take 1 tablet by mouth every 4 (four) hours as needed for up to 5 days for severe pain. 10/15/19 10/20/19  Dionne Bucy, MD  rosuvastatin (CRESTOR) 5 MG tablet Take by mouth. 07/28/18 07/28/19  [provider]  tamsulosin (FLOMAX) 0.4 MG CAPS capsule Take 1 capsule (0.4 mg total) by mouth daily after supper for 5 days. 10/15/19 10/20/19  Dionne Bucy, MD    Allergies Patient has no known allergies.  Family History  Problem Relation Age of Onset  . Hypertension Father   . Diabetes Maternal Uncle   . Cancer Neg Hx   . Stroke Neg Hx   . Hearing loss Neg Hx     Social History Social History   Tobacco Use  . Smoking status: Never Smoker  . Smokeless tobacco: Never Used  Substance Use Topics  . Alcohol use: No    Alcohol/week: 0.0 standard drinks  . Drug use: No    Review of Systems  Constitutional: No fever/chills. Eyes: No redness. ENT: No sore throat. Cardiovascular: Denies chest pain. Respiratory: Denies shortness of breath. Gastrointestinal: Positive for nausea and vomiting. Genitourinary: Negative for dysuria.  Musculoskeletal: Positive for back  pain. Skin: Negative for rash. Neurological: Negative for headaches, focal weakness or numbness.   ____________________________________________   PHYSICAL EXAM:  VITAL SIGNS: ED Triage Vitals  Enc Vitals Group     BP 10/15/19 0622 (!) 169/100     Pulse Rate 10/15/19 0622 89     Resp 10/15/19 0622 18     Temp 10/15/19 0622 98.3 F (36.8 C)     Temp Source 10/15/19 0622 Oral     SpO2 10/15/19 0622 97 %     Weight 10/15/19 0623 190 lb (86.2 kg)     Height 10/15/19 0623 5' (1.524 m)     Head  Circumference --      Peak Flow --      Pain Score 10/15/19 0623 6     Pain Loc --      Pain Edu? --      Excl. in GC? --     Constitutional: Alert and oriented.  Uncomfortable appearing but in no acute distress. Eyes: Conjunctivae are normal.  No scleral icterus. Head: Atraumatic. Nose: No congestion/rhinnorhea. Mouth/Throat: Mucous membranes are moist.   Neck: Normal range of motion.  Cardiovascular: Normal rate, regular rhythm.  Good peripheral circulation. Respiratory: Normal respiratory effort.  No retractions.  Gastrointestinal: Soft with moderate right lower quadrant and right flank tenderness.  No distention.  Genitourinary: Minimal right CVA tenderness. Musculoskeletal:  Extremities warm and well perfused.  Neurologic:  Normal speech and language. No gross focal neurologic deficits are appreciated.  Skin:  Skin is warm and dry. No rash noted. Psychiatric: Mood and affect are normal. Speech and behavior are normal.  ____________________________________________   LABS (all labs ordered are listed, but only abnormal results are displayed)  Labs Reviewed  COMPREHENSIVE METABOLIC PANEL - Abnormal; Notable for the following components:      Result Value   Glucose, Bld 211 (*)    BUN 21 (*)    All other components within normal limits  URINALYSIS, COMPLETE (UACMP) WITH MICROSCOPIC - Abnormal; Notable for the following components:   Color, Urine STRAW (*)    APPearance CLEAR (*)    Glucose, UA >=500 (*)    Hgb urine dipstick SMALL (*)    Ketones, ur 5 (*)    All other components within normal limits  GLUCOSE, CAPILLARY - Abnormal; Notable for the following components:   Glucose-Capillary 239 (*)    All other components within normal limits  LIPASE, BLOOD  CBC  POC URINE PREG, ED  POCT PREGNANCY, URINE   ____________________________________________  EKG   ____________________________________________  RADIOLOGY  CT abdomen: 6 mm right ureteral stone at the UVJ  with mild to moderate hydronephrosis  ____________________________________________   PROCEDURES  Procedure(s) performed: No  Procedures  Critical Care performed: No ____________________________________________   INITIAL IMPRESSION / ASSESSMENT AND PLAN / ED COURSE  Pertinent labs & imaging results that were available during my care of the patient were reviewed by me and considered in my medical decision making (see chart for details).  49 year old female with PMH as noted above including diabetes and hypertension presents with right lower quadrant abdominal pain since 530 this morning which is somewhat radiates to the right flank and back.  She has associated nausea and vomiting.  She has not had any prior abdominal surgeries.  On exam she is uncomfortable but overall well-appearing.  Her vital signs are normal except for hypertension.  The physical exam is remarkable for right lower quadrant abdominal tenderness with no peritoneal signs.  There  is some tenderness into the right flank and right CVA area.  Primary differential is acute appendicitis versus ureteral stone.  Differential also includes UTI/pyelonephritis, colitis, diverticulitis, or less likely hepatobiliary cause.  There is no evidence of vascular etiology.  Lab work-up is unremarkable.  We will proceed with CT for further evaluation.  ----------------------------------------- 1:21 PM on 10/15/2019 -----------------------------------------  CT shows a 6 mm stone at the right UVJ.  The patient's pain responded well to morphine, and she has now been comfortable for several hours.  She no longer has nausea or vomiting and is tolerating p.o.  Although the stone is large, given her well-controlled pain there is no indication for emergent urology evaluation or intervention.  She feels comfortable and wants to go home.  I counseled her on the results of the work-up.  I will prescribe Flomax, ibuprofen and Percocet, and provide  urology referral.  Return precautions given, and she expresses understanding. ____________________________________________   FINAL CLINICAL IMPRESSION(S) / ED DIAGNOSES  Final diagnoses:  Kidney stone      NEW MEDICATIONS STARTED DURING THIS VISIT:  New Prescriptions   IBUPROFEN (ADVIL) 600 MG TABLET    Take 1 tablet (600 mg total) by mouth every 6 (six) hours as needed.   OXYCODONE-ACETAMINOPHEN (PERCOCET) 5-325 MG TABLET    Take 1 tablet by mouth every 4 (four) hours as needed for up to 5 days for severe pain.   TAMSULOSIN (FLOMAX) 0.4 MG CAPS CAPSULE    Take 1 capsule (0.4 mg total) by mouth daily after supper for 5 days.     Note:  This document was prepared using Dragon voice recognition software and may include unintentional dictation errors.    Dionne Bucy, MD 10/15/19 1322

## 2019-10-15 NOTE — ED Notes (Signed)
Pt worried that her CBG might be low due to not eating. CBG checked.

## 2019-10-15 NOTE — ED Notes (Signed)
Pt presents to the ED for RLQ abdominal pain that started approx 0500 this morning. Pt states that it woke her up out of her sleep. Pt also c/o nausea without vomiting. Denies diarrhea and fever. Pt is A&Ox4 but seems uncomfortable

## 2019-10-15 NOTE — ED Notes (Signed)
Pt nervous about getting IV out and states that's why her heart rate is up.

## 2019-10-15 NOTE — ED Triage Notes (Signed)
Patient with complaint of right lower abdominal pain that started about an hour ago. Patient denies vomiting or urinary symptoms.

## 2019-10-15 NOTE — Discharge Instructions (Signed)
Take the Flomax nightly for the next several days until the stone passes.  You should take the ibuprofen every 6 hours as needed as the primary pain medication, with the Percocet on top of this if needed for more severe pain.  Make sure to drink plenty of fluids.  Call the urologist office tomorrow to make a follow-up appointment.  Return to the ER immediately for new, worsening, or persistent severe pain, fever chills, vomiting, or any other new or worsening symptoms that concern you.

## 2019-10-15 NOTE — ED Notes (Signed)
Dr. Marisa Severin, MD at bedside for reevaluation

## 2021-09-10 IMAGING — CT CT ABD-PELV W/ CM
2 of 5 series · 16 of 46 positions shown, 18 images · IV contrast (omnipaque)
Comparison: None.

CLINICAL DATA: Acute onset right flank and right lower quadrant
pain at 5 a.m. last night.

EXAM:
CT ABDOMEN AND PELVIS WITH CONTRAST
TECHNIQUE: Multidetector CT imaging of the abdomen and pelvis was performed
using the standard protocol following bolus administration of
intravenous contrast.
CONTRAST:  100 mL OMNIPAQUE IOHEXOL 300 MG/ML  SOLN

[Series 2: routine abd/pel with · axial · 0.77mm/px · z∈[-476,-56]mm · 13 of 96 slices shown, 15 images]
[im 6/96  soft-tissue]
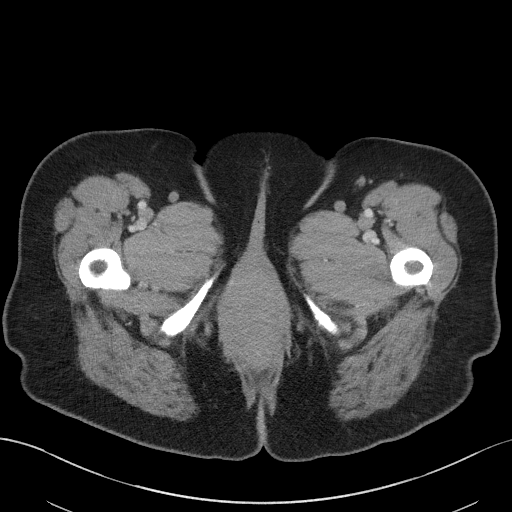
[im 6/96  bone]
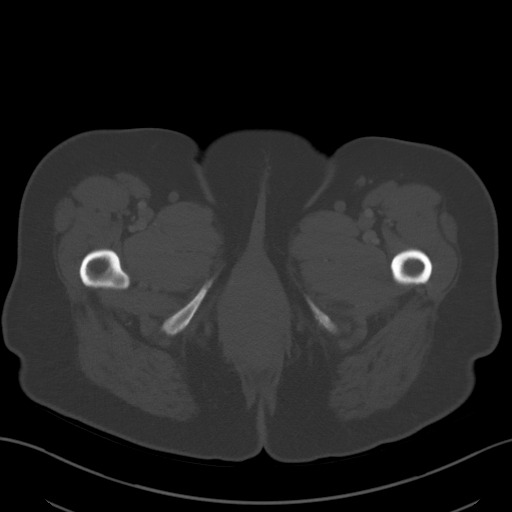
[im 11/96  soft-tissue]
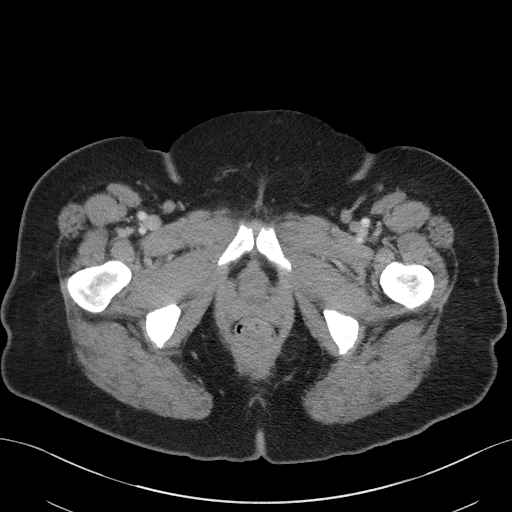
[im 22/96  soft-tissue]
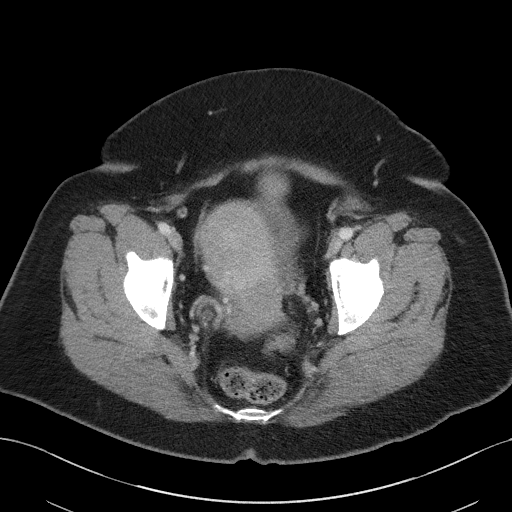
[im 27/96  soft-tissue]
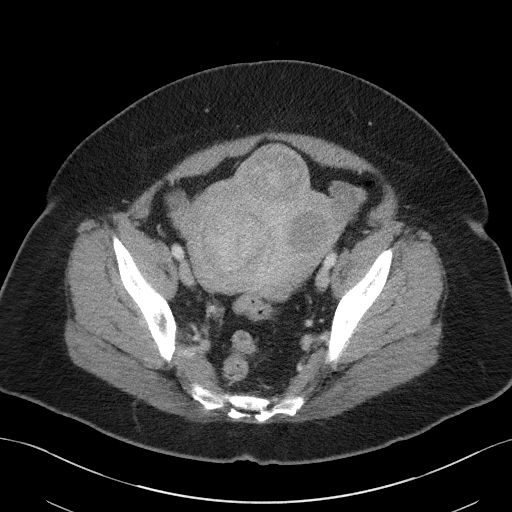
[im 32/96  soft-tissue]
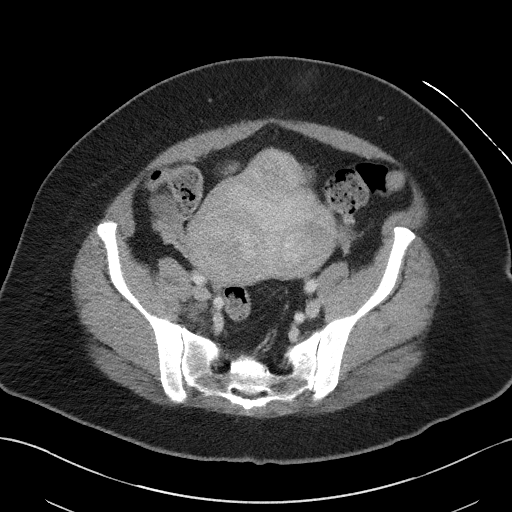
[im 43/96  soft-tissue]
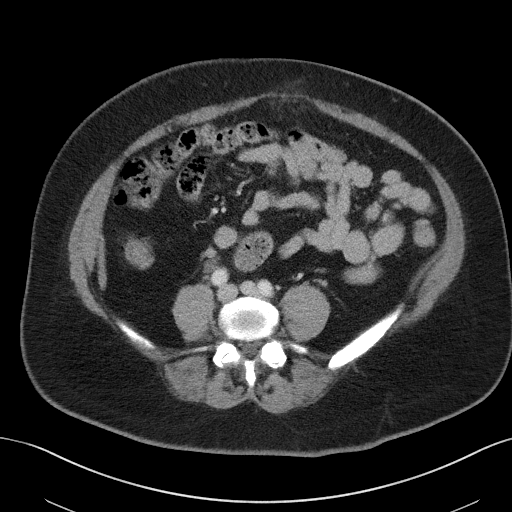
[im 48/96  soft-tissue]
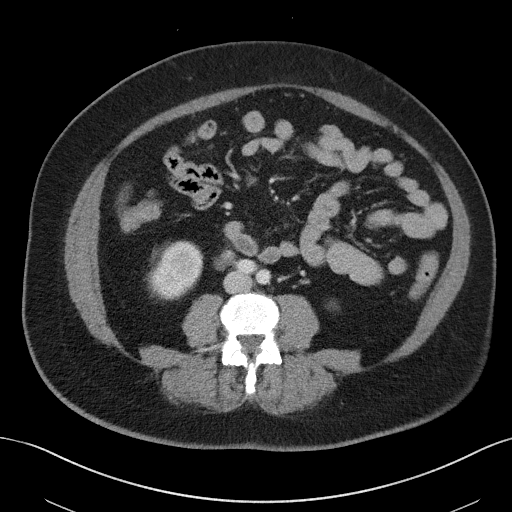
[im 53/96  soft-tissue]
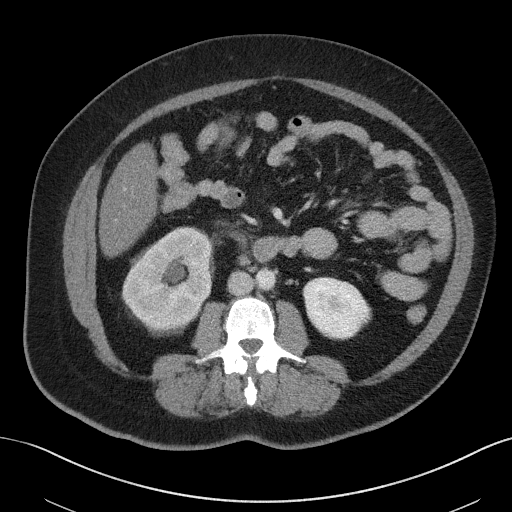
[im 64/96  soft-tissue]
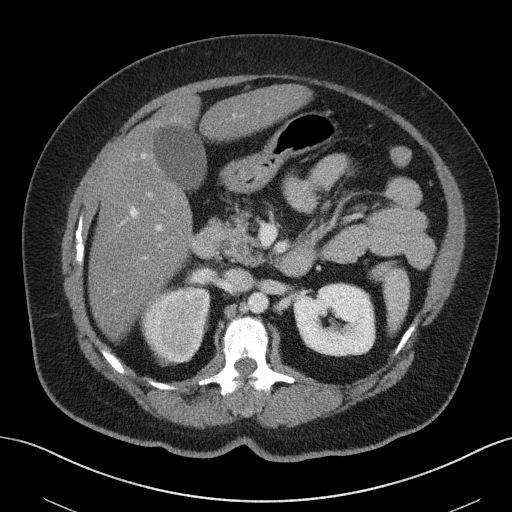
[im 64/96  bone]
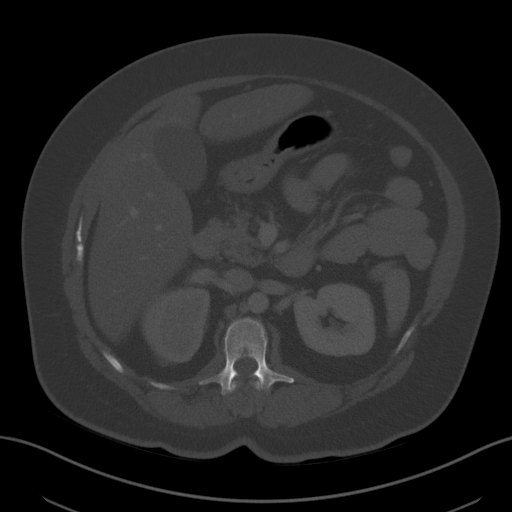
[im 69/96  soft-tissue]
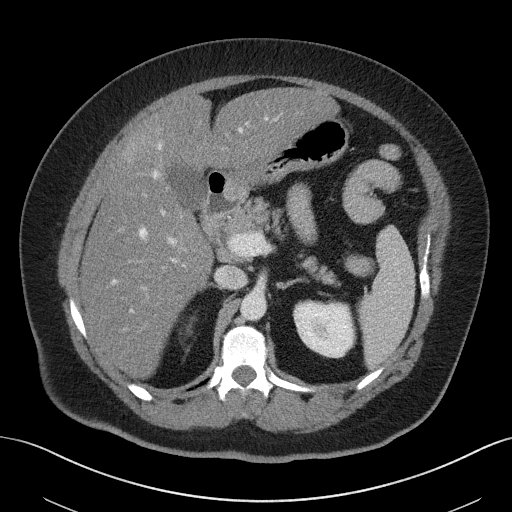
[im 74/96  soft-tissue]
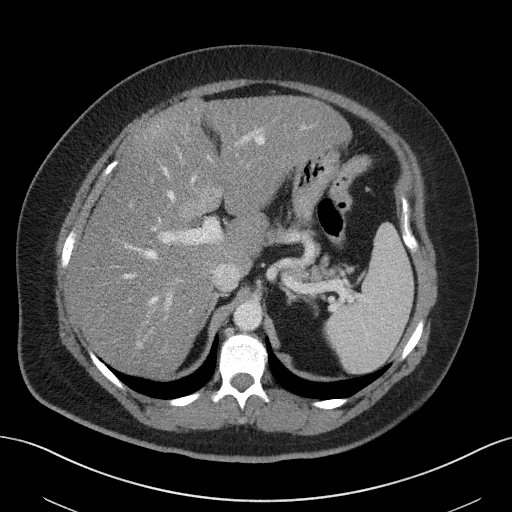
[im 85/96  soft-tissue]
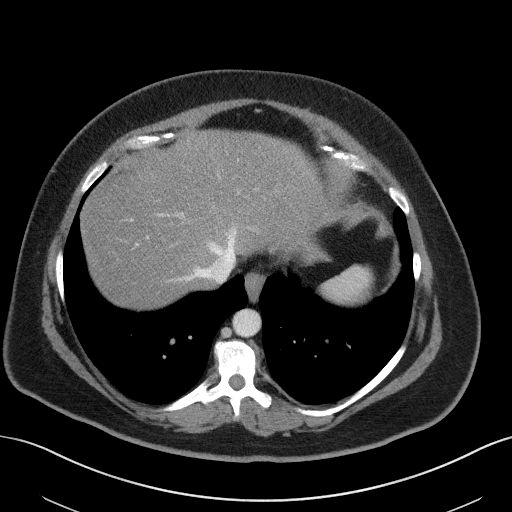
[im 90/96  soft-tissue]
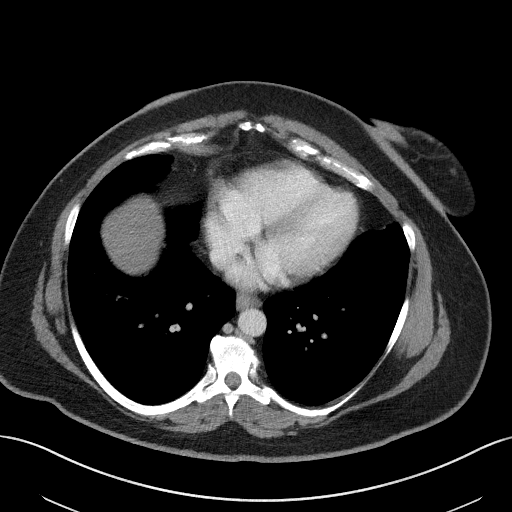

[Series 5: coronal st · coronal · 0.76mm/px · 3 of 112 slices shown]
[im 38/112  soft-tissue]
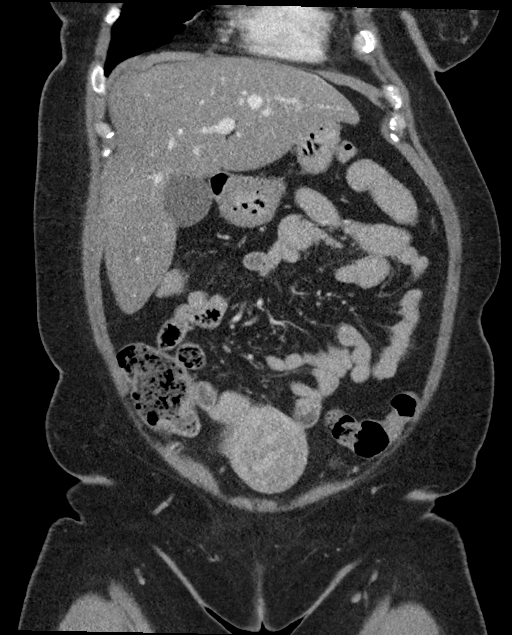
[im 50/112  soft-tissue]
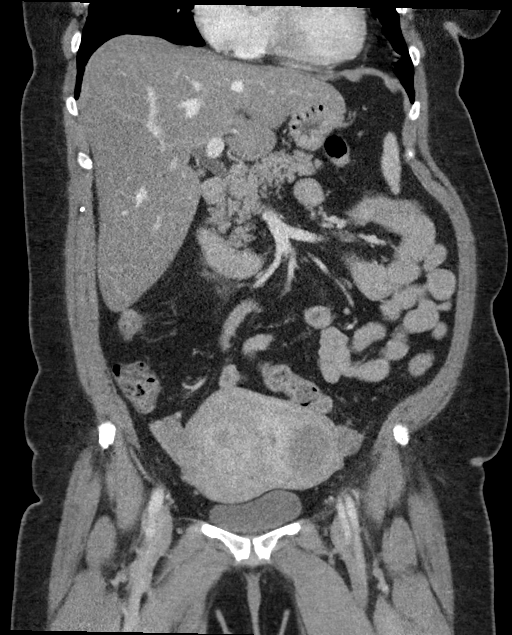
[im 62/112  soft-tissue]
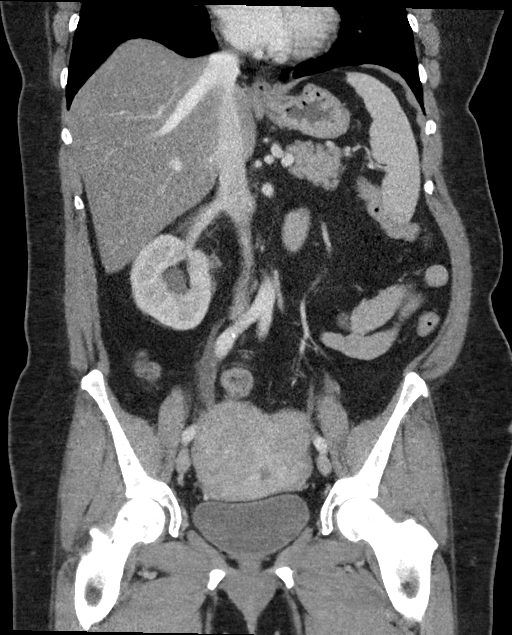

[16 of 46 positions shown; findings below may reference images not displayed]

FINDINGS: Lower chest: Lung bases clear.  No pleural or pericardial effusion.

Hepatobiliary: No focal liver abnormality is seen. No gallstones,
gallbladder wall thickening, or biliary dilatation. The liver is
diffusely low attenuating consistent with fatty infiltration. The
liver measures 21 cm craniocaudal.

Pancreas: Unremarkable. No pancreatic ductal dilatation or
surrounding inflammatory changes.

Spleen: Normal in size without focal abnormality.

Adrenals/Urinary Tract: The patient has moderate right
hydronephrosis and delayed contrast excretion from the right kidney
due to a 0.6 cm stone at the right ureterovesical junction. No other
urinary tract stones are identified. No focal renal lesion. Urinary
bladder appears normal.

Stomach/Bowel: Stomach is within normal limits. Appendix appears
normal. No evidence of bowel wall thickening, distention, or
inflammatory changes.

Vascular/Lymphatic: No significant vascular findings are present. No
enlarged abdominal or pelvic lymph nodes.

Reproductive: The patient has a bulky fibroid uterus. Fibroids
measure up to approximately 6 cm in diameter. Adnexa unremarkable.

Other: Fat containing umbilical hernia is seen.

Musculoskeletal: No acute or focal abnormality.
IMPRESSION: Mild to moderate right hydronephrosis due to a 0.6 cm stone at the
right UVJ. No other acute abnormality.

Hepatomegaly and fatty infiltration of the liver.

Fat containing umbilical hernia.

Fibroid uterus.

## 2021-12-25 ENCOUNTER — Ambulatory Visit: Payer: BC Managed Care – PPO | Admitting: Podiatry

## 2022-01-09 ENCOUNTER — Telehealth: Payer: Self-pay | Admitting: Physician Assistant

## 2022-01-09 DIAGNOSIS — B9689 Other specified bacterial agents as the cause of diseases classified elsewhere: Secondary | ICD-10-CM

## 2022-01-09 DIAGNOSIS — J019 Acute sinusitis, unspecified: Secondary | ICD-10-CM

## 2022-01-09 MED ORDER — PREDNISONE 20 MG PO TABS: 20.0000 mg | ORAL_TABLET | Freq: Every day | ORAL | 0 refills | Status: DC

## 2022-01-09 MED ORDER — AMOXICILLIN-POT CLAVULANATE 875-125 MG PO TABS: 1.0000 | ORAL_TABLET | Freq: Two times a day (BID) | ORAL | 0 refills | Status: DC

## 2022-01-09 NOTE — Patient Instructions (Signed)
Stan Head Gassert, thank you for joining Margaretann Loveless, PA-C for today's virtual visit.  While this provider is not your primary care provider (PCP), if your PCP is located in our provider database this encounter information will be shared with them immediately following your visit.  A [facility] MyChart account gives you access to today's visit and all your visits, tests, and labs performed at [facility] " click here if you don't have a [facility] MyChart account or go to [email or web address]  Consent: (Patient) Whitney Palmer provided verbal consent for this virtual visit at the beginning of the encounter.  Current Medications:  Current Outpatient Medications:    amoxicillin-clavulanate (AUGMENTIN) 875-125 MG tablet, Take 1 tablet by mouth 2 (two) times daily., Disp: 20 tablet, Rfl: 0   predniSONE (DELTASONE) 20 MG tablet, Take 1 tablet (20 mg total) by mouth daily with breakfast., Disp: 7 tablet, Rfl: 0   albuterol (PROVENTIL HFA;VENTOLIN HFA) 108 (90 BASE) MCG/ACT inhaler, Inhale 2 puffs into the lungs every 6 (six) hours as needed for wheezing or shortness of breath., Disp: 1 Inhaler, Rfl: 2   Cetirizine HCl 10 MG CAPS, Take 1 capsule (10 mg total) by mouth daily. (Patient taking differently: Take 1 capsule by mouth as needed. ), Disp: 90 capsule, Rfl: 2   fluticasone (FLONASE) 50 MCG/ACT nasal spray, Place into the nose as needed. , Disp: , Rfl:    glucose blood test strip, Use as instructed, Disp: 50 each, Rfl: 11   hydrochlorothiazide (HYDRODIURIL) 25 MG tablet, Take 1 tablet (25 mg total) by mouth daily., Disp: 90 tablet, Rfl: 1   ibuprofen (ADVIL) 600 MG tablet, Take 1 tablet (600 mg total) by mouth every 6 (six) hours as needed., Disp: 30 tablet, Rfl: 0   metFORMIN (GLUCOPHAGE-XR) 500 MG 24 hr tablet, 500 mg. 1 tab in am and 2 tabs at bedtime, Disp: , Rfl:    rosuvastatin (CRESTOR) 5 MG tablet, Take by mouth., Disp: , Rfl:    Medications ordered in this  encounter:  Meds ordered this encounter  Medications   amoxicillin-clavulanate (AUGMENTIN) 875-125 MG tablet    Sig: Take 1 tablet by mouth 2 (two) times daily.    Dispense:  20 tablet    Refill:  0    Order Specific Question:   Supervising Provider    Answer:   Merrilee Jansky [[id number]]   predniSONE (DELTASONE) 20 MG tablet    Sig: Take 1 tablet (20 mg total) by mouth daily with breakfast.    Dispense:  7 tablet    Refill:  0    Order Specific Question:   Supervising Provider    Answer:   Merrilee Jansky [[id number]]     *If you need refills on other medications prior to your next appointment, please contact your pharmacy*  Follow-Up: Call back or seek an in-person evaluation if the symptoms worsen or if the condition fails to improve as anticipated.  [facility] [phone]  Other Instructions  Sinus Infection, Adult A sinus infection, also called sinusitis, is inflammation of your sinuses. Sinuses are hollow spaces in the bones around your face. Your sinuses are located: Around your eyes. In the middle of your forehead. Behind your nose. In your cheekbones. Mucus normally drains out of your sinuses. When your nasal tissues become inflamed or swollen, mucus can become trapped or blocked. This allows bacteria, viruses, and fungi to grow, which leads to infection. Most infections of the sinuses are caused  by a virus. A sinus infection can develop quickly. It can last for up to 4 weeks (acute) or for more than 12 weeks (chronic). A sinus infection often develops after a cold. What are the causes? This condition is caused by anything that creates swelling in the sinuses or stops mucus from draining. This includes: Allergies. Asthma. Infection from bacteria or viruses. Deformities or blockages in your nose or sinuses. Abnormal growths in the nose (nasal polyps). Pollutants, such as chemicals or irritants in the air. Infection from fungi. This is rare. What  increases the risk? You are more likely to develop this condition if you: Have a weak body defense system (immune system). Do a lot of swimming or diving. Overuse nasal sprays. Smoke. What are the signs or symptoms? The main symptoms of this condition are pain and a feeling of pressure around the affected sinuses. Other symptoms include: Stuffy nose or congestion that makes it difficult to breathe through your nose. Thick yellow or greenish drainage from your nose. Tenderness, swelling, and warmth over the affected sinuses. A cough that may get worse at night. Decreased sense of smell and taste. Extra mucus that collects in the throat or the back of the nose (postnasal drip) causing a sore throat or bad breath. Tiredness (fatigue). Fever. How is this diagnosed? This condition is diagnosed based on: Your symptoms. Your medical history. A physical exam. Tests to find out if your condition is acute or chronic. This may include: Checking your nose for nasal polyps. Viewing your sinuses using a device that has a light (endoscope). Testing for allergies or bacteria. Imaging tests, such as an MRI or CT scan. In rare cases, a bone biopsy may be done to rule out more serious types of fungal sinus disease. How is this treated? Treatment for a sinus infection depends on the cause and whether your condition is chronic or acute. If caused by a virus, your symptoms should go away on their own within 10 days. You may be given medicines to relieve symptoms. They include: Medicines that shrink swollen nasal passages (decongestants). A spray that eases inflammation of the nostrils (topical intranasal corticosteroids). Rinses that help get rid of thick mucus in your nose (nasal saline washes). Medicines that treat allergies (antihistamines). Over-the-counter pain relievers. If caused by bacteria, your health care provider may recommend waiting to see if your symptoms improve. Most bacterial  infections will get better without antibiotic medicine. You may be given antibiotics if you have: A severe infection. A weak immune system. If caused by narrow nasal passages or nasal polyps, surgery may be needed. Follow these instructions at home: Medicines Take, use, or apply over-the-counter and prescription medicines only as told by your health care provider. These may include nasal sprays. If you were prescribed an antibiotic medicine, take it as told by your health care provider. Do not stop taking the antibiotic even if you start to feel better. Hydrate and humidify  Drink enough fluid to keep your urine pale yellow. Staying hydrated will help to thin your mucus. Use a cool mist humidifier to keep the humidity level in your home above 50%. Inhale steam for 10-15 minutes, 3-4 times a day, or as told by your health care provider. You can do this in the bathroom while a hot shower is running. Limit your exposure to cool or dry air. Rest Rest as much as possible. Sleep with your head raised (elevated). Make sure you get enough sleep each night. General instructions  Apply a  warm, moist washcloth to your face 3-4 times a day or as told by your health care provider. This will help with discomfort. Use nasal saline washes as often as told by your health care provider. Wash your hands often with soap and water to reduce your exposure to germs. If soap and water are not available, use hand sanitizer. Do not smoke. Avoid being around people who are smoking (secondhand smoke). Keep all follow-up visits. This is important. Contact a health care provider if: You have a fever. Your symptoms get worse. Your symptoms do not improve within 10 days. Get help right away if: You have a severe headache. You have persistent vomiting. You have severe pain or swelling around your face or eyes. You have vision problems. You develop confusion. Your neck is stiff. You have trouble breathing. These  symptoms may be an emergency. Get help right away. Call 911. Do not wait to see if the symptoms will go away. Do not drive yourself to the hospital. Summary A sinus infection is soreness and inflammation of your sinuses. Sinuses are hollow spaces in the bones around your face. This condition is caused by nasal tissues that become inflamed or swollen. The swelling traps or blocks the flow of mucus. This allows bacteria, viruses, and fungi to grow, which leads to infection. If you were prescribed an antibiotic medicine, take it as told by your health care provider. Do not stop taking the antibiotic even if you start to feel better. Keep all follow-up visits. This is important. This information is not intended to replace advice given to you by your health care provider. Make sure you discuss any questions you have with your health care provider. Document Revised: 02/18/2021 Document Reviewed: 02/18/2021 Elsevier Patient Education  2023 Elsevier Inc.    If you have been instructed to have an in-person evaluation today at a local Urgent Care facility, please use the link below. It will take you to a list of all of our available [facility], including address, phone number and hours of operation. Please do not delay care.  [facility]  If you or a family member do not have a primary care provider, use the link below to schedule a visit and establish care. When you choose a [facility] primary care physician or advanced practice provider, you gain a long-term partner in health. Find a Primary Care Provider  Learn more about [facility]'s in-office and virtual care options: [facility] - Get Care Now

## 2022-01-09 NOTE — Progress Notes (Signed)
Virtual Visit Consent   Whitney Palmer, you are scheduled for a virtual visit with a [facility] provider today. Just as with appointments in the office, your consent must be obtained to participate. Your consent will be active for this visit and any virtual visit you may have with one of our providers in the next 365 days. If you have a MyChart account, a copy of this consent can be sent to you electronically.  As this is a virtual visit, video technology does not allow for your provider to perform a traditional examination. This may limit your provider's ability to fully assess your condition. If your provider identifies any concerns that need to be evaluated in person or the need to arrange testing (such as labs, EKG, etc.), we will make arrangements to do so. Although advances in technology are sophisticated, we cannot ensure that it will always work on either your end or our end. If the connection with a video visit is poor, the visit may have to be switched to a telephone visit. With either a video or telephone visit, we are not always able to ensure that we have a secure connection.  By engaging in this virtual visit, you consent to the provision of healthcare and authorize for your insurance to be billed (if applicable) for the services provided during this visit. Depending on your insurance coverage, you may receive a charge related to this service.  I need to obtain your verbal consent now. Are you willing to proceed with your visit today? Whitney Palmer has provided verbal consent on 01/09/2022 for a virtual visit (video or telephone). Margaretann Loveless, PA-C  Date: 01/09/2022 6:18 PM  Virtual Visit via Video Note   I, Margaretann Loveless, connected with  Whitney Palmer  ([id number], 02-Sep-1970) on 01/09/22 at  6:00 PM EDT by a video-enabled telemedicine application and verified that I am speaking with the correct person using two identifiers.  Location: Patient: Virtual Visit  Location Patient: Home Provider: Virtual Visit Location Provider: Home Office   I discussed the limitations of evaluation and management by telemedicine and the availability of in person appointments. The patient expressed understanding and agreed to proceed.    History of Present Illness: Whitney Palmer is a 51 y.o. who identifies as a female who was assigned female at birth, and is being seen today for possible sinusitis.  HPI: Sinusitis This is a new problem. 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 The problem has been gradually worsening since onset. The maximum temperature recorded prior to her arrival was 100.4 - 100.9 F. The fever has been present for Less than 1 day. The pain is moderate. Associated symptoms include congestion, ear pain (ear fullness), headaches, a hoarse voice, sinus pressure and a sore throat. Pertinent negatives include no shortness of breath. Past treatments include saline nose sprays, acetaminophen and nasal decongestants (salt water gargles). The treatment provided no relief.  Covid testing at work has been negative over last 3 days  Last A1c was 9.6 in June; Blood glucose today after eating was 167, typically has been in 130s  Problems:  Patient Active Problem List   Diagnosis Date Noted   HLD (hyperlipidemia) 09/17/2015   Allergy-induced asthma 10/13/2014   Diabetes mellitus, type II (HCC) 10/13/2014   Benign essential HTN 10/13/2014    Allergies: No Known Allergies Medications:  Current Outpatient Medications:    amoxicillin-clavulanate (AUGMENTIN)  875-125 MG tablet, Take 1 tablet by mouth 2 (two) times daily., Disp: 20 tablet, Rfl: 0   predniSONE (DELTASONE) 20 MG tablet, Take 1 tablet (20 mg total) by mouth daily with breakfast., Disp: 7 tablet, Rfl: 0   albuterol (PROVENTIL HFA;VENTOLIN HFA) 108 (90 BASE) MCG/ACT inhaler, Inhale 2 puffs into the lungs every 6 (six) hours as needed  for wheezing or shortness of breath., Disp: 1 Inhaler, Rfl: 2   Cetirizine HCl 10 MG CAPS, Take 1 capsule (10 mg total) by mouth daily. (Patient taking differently: Take 1 capsule by mouth as needed. ), Disp: 90 capsule, Rfl: 2   fluticasone (FLONASE) 50 MCG/ACT nasal spray, Place into the nose as needed. , Disp: , Rfl:    glucose blood test strip, Use as instructed, Disp: 50 each, Rfl: 11   hydrochlorothiazide (HYDRODIURIL) 25 MG tablet, Take 1 tablet (25 mg total) by mouth daily., Disp: 90 tablet, Rfl: 1   ibuprofen (ADVIL) 600 MG tablet, Take 1 tablet (600 mg total) by mouth every 6 (six) hours as needed., Disp: 30 tablet, Rfl: 0   metFORMIN (GLUCOPHAGE-XR) 500 MG 24 hr tablet, 500 mg. 1 tab in am and 2 tabs at bedtime, Disp: , Rfl:    rosuvastatin (CRESTOR) 5 MG tablet, Take by mouth., Disp: , Rfl:   Observations/Objective: Patient is well-developed, well-nourished in no acute distress.  Resting comfortably at home.  Head is normocephalic, atraumatic.  No labored breathing.  Speech is clear and coherent with logical content.  Patient is alert and oriented at baseline.  Hoarse voice and nasal congestion noted  Assessment and Plan: 1. Acute bacterial sinusitis - amoxicillin-clavulanate (AUGMENTIN) 875-125 MG tablet; Take 1 tablet by mouth 2 (two) times daily.  Dispense: 20 tablet; Refill: 0 - predniSONE (DELTASONE) 20 MG tablet; Take 1 tablet (20 mg total) by mouth daily with breakfast.  Dispense: 7 tablet; Refill: 0  - Worsening symptoms that have not responded to OTC medications.  - Will give Augmentin and prednisone (monitor glucose closely and stop prednisone and be seen in person immediately if sugars go above 400) - Continue allergy medications.  - Steam and humidifier can help - Stay well hydrated and get plenty of rest.  - Seek in person evaluation if no symptom improvement or if symptoms worsen   Follow Up Instructions: I discussed the assessment and treatment plan with the  patient. The patient was provided an opportunity to ask questions and all were answered. The patient agreed with the plan and demonstrated an understanding of the instructions.  A copy of instructions were sent to the patient via MyChart unless otherwise noted below.    The patient was advised to call back or seek an in-person evaluation if the symptoms worsen or if the condition fails to improve as anticipated.  Time:  I spent 15 minutes with the patient via telehealth technology discussing the above problems/concerns.    Mar Daring, PA-C

## 2023-04-26 ENCOUNTER — Other Ambulatory Visit: Payer: Self-pay | Admitting: Infectious Diseases

## 2023-04-26 DIAGNOSIS — Z1231 Encounter for screening mammogram for malignant neoplasm of breast: Secondary | ICD-10-CM

## 2023-08-03 ENCOUNTER — Ambulatory Visit: Payer: Self-pay | Admitting: General Surgery

## 2023-08-03 NOTE — H&P (Signed)
 History of Present Illness Whitney Palmer is a 53 year old female who presents for preoperative evaluation for umbilical hernia repair.  She has no new issues with her umbilical hernia, including no pain, discomfort, or bulging. Occasionally, the hernia feels larger, but she is able to reduce it manually. No significant increase in size or discomfort is noted. She feels 'a little nervous but excited' about the upcoming surgery.  She inquires about postoperative care, including the duration of the surgery, which is expected to be about an hour, and confirms that she will be able to go home the same day. She asks about showering and is informed that she can shower the same day or the next day, with skin glue covering the incision. She discusses pain management, noting that she has taken pain medication before and sometimes experiences nausea as a side effect.  She discusses her medication, Mounjaro, and plans to adjust the timing of her dose to avoid complications with anesthesia. She plans to take it on Thursday, prior to the surgery scheduled for the 23rd, and resume the day after surgery. She expresses concern about her blood sugar dropping if she hasn't eaten by a certain time and hopes to be scheduled for surgery early in the morning.  She discusses her postoperative recovery, including concerns about bowel movements and the potential for constipation due to pain medication. She plans to use fiber, Miralax, or prune juice to manage this. She also discusses her work schedule, noting that she has short-term disability available and plans to take three weeks off work.  No new issues with her hernia, including pain, discomfort, or bulging. Occasional nausea with pain medication use.      PAST MEDICAL HISTORY:  Past Medical History:  Diagnosis Date   Allergic state 1984   Cats cigarette smoke strong perfumes aerosol sprays   DM2 (diabetes mellitus, type 2) (CMS/HHS-HCC)    Enlarged tonsils     Hyperlipidemia 2016   Hypertension 2013   Intermittent asthma (HHS-HCC)    Obesity         PAST SURGICAL HISTORY:   History reviewed. No pertinent surgical history.       MEDICATIONS:  Outpatient Encounter Medications as of 08/03/2023  Medication Sig Dispense Refill   acetaminophen  (TYLENOL ) 500 MG tablet Take 500 mg by mouth every 6 (six) hours     albuterol  MDI, PROVENTIL , VENTOLIN , PROAIR , HFA 90 mcg/actuation inhaler Inhale 2 inhalations into the lungs every 6 (six) hours as needed for Wheezing 1 each 11   blood glucose diagnostic test strip Use 2 (two) times daily Use as instructed. 100 each 3   fluticasone propionate (FLONASE) 50 mcg/actuation nasal spray SPRAY ONE SPRAY INTO BOTH NOSTRILS TWO TIMES DAILY 16 mL 1   lisinopriL  (ZESTRIL ) 10 MG tablet TAKE 1 TABLET BY MOUTH DAILY 90 tablet 1   MOUNJARO 10 mg/0.5 mL pen injector INJECT 10 MG UNDER THE SKIN ONCE WEEKLY 2 mL 2   ondansetron  (ZOFRAN -ODT) 4 MG disintegrating tablet Take 1 tablet (4 mg total) by mouth every 12 (twelve) hours as needed for Nausea 30 tablet 5   rosuvastatin (CRESTOR) 5 MG tablet TAKE 1 TABLET BY MOUTH DAILY 90 tablet 1   triamcinolone 0.1 % ointment Apply topically 2 (two) times daily 80 g 0   brompheniramine-pseudoephed-DM (BROMFED DM) 2-30-10 mg/5 mL syrup Take 5 mLs by mouth every 6 (six) hours as needed (Patient not taking: Reported on 08/03/2023) 118 mL 0   lancets Use 1 each 2 (  two) times daily Use as instructed. 100 each 3   No facility-administered encounter medications on file as of 08/03/2023.     ALLERGIES:   Patient has no known allergies.   SOCIAL HISTORY:  Social History   Socioeconomic History   Marital status: Radiation protection practitioner  Tobacco Use   Smoking status: Never   Smokeless tobacco: Never  Vaping Use   Vaping status: Never Used  Substance and Sexual Activity   Alcohol use: Not Currently    Alcohol/week: 0.0 standard drinks of alcohol   Drug use: Never   Sexual activity: Not Currently     Partners: Male   Social Drivers of Health   Financial Resource Strain: Low Risk  (06/24/2023)   Overall Financial Resource Strain (CARDIA)    Difficulty of Paying Living Expenses: Not hard at all  Food Insecurity: No Food Insecurity (06/24/2023)   Hunger Vital Sign    Worried About Running Out of Food in the Last Year: Never true    Ran Out of Food in the Last Year: Never true  Transportation Needs: No Transportation Needs (06/24/2023)   PRAPARE - Administrator, Civil Service (Medical): No    Lack of Transportation (Non-Medical): No    FAMILY HISTORY:  Family History  Problem Relation Name Age of Onset   Goiter Mother     Thyroid disease Mother     High blood pressure (Hypertension) Father Griffeth Brownstein    Deep vein thrombosis (DVT or abnormal blood clot formation) Father Gaylyn Keas Yeates    Pulmonary embolism Father Pollinger Picker 72   Obesity Daughter Ardelia Beau    Lung cancer Maternal Grandfather     Lung cancer Paternal Grandmother       GENERAL REVIEW OF SYSTEMS:   General ROS: negative for - chills, fatigue, fever, weight gain or weight loss Allergy and Immunology ROS: negative for - hives  Hematological and Lymphatic ROS: negative for - bleeding problems or bruising, negative for palpable nodes Endocrine ROS: negative for - heat or cold intolerance, hair changes Respiratory ROS: negative for - cough, shortness of breath or wheezing Cardiovascular ROS: no chest pain or palpitations GI ROS: negative for nausea, vomiting, abdominal pain, diarrhea, constipation Musculoskeletal ROS: negative for - joint swelling or muscle pain Neurological ROS: negative for - confusion, syncope Dermatological ROS: negative for pruritus and rash  PHYSICAL EXAM:  Vitals:   08/03/23 0902  BP: 106/68  Pulse: 75  .  Ht:152.4 cm (5') Wt:68.5 kg (151 lb) ZOX:WRUE surface area is 1.7 meters squared. Body mass index is 29.49 kg/m.Whitney Palmer   GENERAL: Alert, active, oriented x3  HEENT:  Pupils equal reactive to light. Extraocular movements are intact. Sclera clear. Palpebral conjunctiva normal red color.Pharynx clear.  NECK: Supple with no palpable mass and no adenopathy.  LUNGS: Sound clear with no rales rhonchi or wheezes.  HEART: Regular rhythm S1 and S2 without murmur.  ABDOMEN: Soft and depressible, nontender with no palpable mass, no hepatomegaly.  Palpable umbilical hernia with tenderness of the patient.  Not completely reducible.  EXTREMITIES: Well-developed well-nourished symmetrical with no dependent edema.  NEUROLOGICAL: Awake alert oriented, facial expression symmetrical, moving all extremities.  I personally evaluated CT scan of the abdomen and pelvis on 10/15/2019.  This shows a small medical hernia with fat-containing tissue.   Assessment & Plan Umbilical hernia without obstruction or gangrene   She is scheduled for umbilical hernia repair with no current pain or discomfort. The plan is to perform an incision under the  umbilicus to access the defect, with an intraoperative decision to use sutures or mesh based on defect size and weakness, though sutures are expected to suffice. The surgery is anticipated to last approximately one hour with same-day discharge. Postoperative care includes instructions on showering, incision care, and pain management. Stronger analgesics are prescribed for potential use on the first night, with advice on managing potential nausea from these medications. She is advised to avoid Mounjaro injection close to the surgery date to prevent anesthesia complications. Postoperative activity, including stair climbing and dietary considerations to prevent constipation, has been discussed.  Constipation   The potential for postoperative constipation, especially with analgesic use, has been discussed. She is advised to maintain regular bowel habits and use fiber, Miralax, or prune juice to prevent constipation. No bowel preparation is required  before surgery unless for personal comfort.   Umbilical hernia without obstruction and without gangrene [K42.9]          Patient verbalized understanding, all questions were answered, and were agreeable with the plan outlined above.   Eldred Grego, MD  Electronically signed by Eldred Grego, MD

## 2023-08-03 NOTE — H&P (View-Only) (Signed)
 History of Present Illness Whitney Palmer is a 53 year old female who presents for preoperative evaluation for umbilical hernia repair.  She has no new issues with her umbilical hernia, including no pain, discomfort, or bulging. Occasionally, the hernia feels larger, but she is able to reduce it manually. No significant increase in size or discomfort is noted. She feels 'a little nervous but excited' about the upcoming surgery.  She inquires about postoperative care, including the duration of the surgery, which is expected to be about an hour, and confirms that she will be able to go home the same day. She asks about showering and is informed that she can shower the same day or the next day, with skin glue covering the incision. She discusses pain management, noting that she has taken pain medication before and sometimes experiences nausea as a side effect.  She discusses her medication, Mounjaro, and plans to adjust the timing of her dose to avoid complications with anesthesia. She plans to take it on Thursday, prior to the surgery scheduled for the 23rd, and resume the day after surgery. She expresses concern about her blood sugar dropping if she hasn't eaten by a certain time and hopes to be scheduled for surgery early in the morning.  She discusses her postoperative recovery, including concerns about bowel movements and the potential for constipation due to pain medication. She plans to use fiber, Miralax, or prune juice to manage this. She also discusses her work schedule, noting that she has short-term disability available and plans to take three weeks off work.  No new issues with her hernia, including pain, discomfort, or bulging. Occasional nausea with pain medication use.      PAST MEDICAL HISTORY:  Past Medical History:  Diagnosis Date   Allergic state 1984   Cats cigarette smoke strong perfumes aerosol sprays   DM2 (diabetes mellitus, type 2) (CMS/HHS-HCC)    Enlarged tonsils     Hyperlipidemia 2016   Hypertension 2013   Intermittent asthma (HHS-HCC)    Obesity         PAST SURGICAL HISTORY:   History reviewed. No pertinent surgical history.       MEDICATIONS:  Outpatient Encounter Medications as of 08/03/2023  Medication Sig Dispense Refill   acetaminophen  (TYLENOL ) 500 MG tablet Take 500 mg by mouth every 6 (six) hours     albuterol  MDI, PROVENTIL , VENTOLIN , PROAIR , HFA 90 mcg/actuation inhaler Inhale 2 inhalations into the lungs every 6 (six) hours as needed for Wheezing 1 each 11   blood glucose diagnostic test strip Use 2 (two) times daily Use as instructed. 100 each 3   fluticasone propionate (FLONASE) 50 mcg/actuation nasal spray SPRAY ONE SPRAY INTO BOTH NOSTRILS TWO TIMES DAILY 16 mL 1   lisinopriL  (ZESTRIL ) 10 MG tablet TAKE 1 TABLET BY MOUTH DAILY 90 tablet 1   MOUNJARO 10 mg/0.5 mL pen injector INJECT 10 MG UNDER THE SKIN ONCE WEEKLY 2 mL 2   ondansetron  (ZOFRAN -ODT) 4 MG disintegrating tablet Take 1 tablet (4 mg total) by mouth every 12 (twelve) hours as needed for Nausea 30 tablet 5   rosuvastatin (CRESTOR) 5 MG tablet TAKE 1 TABLET BY MOUTH DAILY 90 tablet 1   triamcinolone 0.1 % ointment Apply topically 2 (two) times daily 80 g 0   brompheniramine-pseudoephed-DM (BROMFED DM) 2-30-10 mg/5 mL syrup Take 5 mLs by mouth every 6 (six) hours as needed (Patient not taking: Reported on 08/03/2023) 118 mL 0   lancets Use 1 each 2 (  two) times daily Use as instructed. 100 each 3   No facility-administered encounter medications on file as of 08/03/2023.     ALLERGIES:   Patient has no known allergies.   SOCIAL HISTORY:  Social History   Socioeconomic History   Marital status: Radiation protection practitioner  Tobacco Use   Smoking status: Never   Smokeless tobacco: Never  Vaping Use   Vaping status: Never Used  Substance and Sexual Activity   Alcohol use: Not Currently    Alcohol/week: 0.0 standard drinks of alcohol   Drug use: Never   Sexual activity: Not Currently     Partners: Male   Social Drivers of Health   Financial Resource Strain: Low Risk  (06/24/2023)   Overall Financial Resource Strain (CARDIA)    Difficulty of Paying Living Expenses: Not hard at all  Food Insecurity: No Food Insecurity (06/24/2023)   Hunger Vital Sign    Worried About Running Out of Food in the Last Year: Never true    Ran Out of Food in the Last Year: Never true  Transportation Needs: No Transportation Needs (06/24/2023)   PRAPARE - Administrator, Civil Service (Medical): No    Lack of Transportation (Non-Medical): No    FAMILY HISTORY:  Family History  Problem Relation Name Age of Onset   Goiter Mother     Thyroid disease Mother     High blood pressure (Hypertension) Father Griffeth Brownstein    Deep vein thrombosis (DVT or abnormal blood clot formation) Father Gaylyn Keas Yeates    Pulmonary embolism Father Pollinger Picker 72   Obesity Daughter Ardelia Beau    Lung cancer Maternal Grandfather     Lung cancer Paternal Grandmother       GENERAL REVIEW OF SYSTEMS:   General ROS: negative for - chills, fatigue, fever, weight gain or weight loss Allergy and Immunology ROS: negative for - hives  Hematological and Lymphatic ROS: negative for - bleeding problems or bruising, negative for palpable nodes Endocrine ROS: negative for - heat or cold intolerance, hair changes Respiratory ROS: negative for - cough, shortness of breath or wheezing Cardiovascular ROS: no chest pain or palpitations GI ROS: negative for nausea, vomiting, abdominal pain, diarrhea, constipation Musculoskeletal ROS: negative for - joint swelling or muscle pain Neurological ROS: negative for - confusion, syncope Dermatological ROS: negative for pruritus and rash  PHYSICAL EXAM:  Vitals:   08/03/23 0902  BP: 106/68  Pulse: 75  .  Ht:152.4 cm (5') Wt:68.5 kg (151 lb) ZOX:WRUE surface area is 1.7 meters squared. Body mass index is 29.49 kg/m.[name]   GENERAL: Alert, active, oriented x3  HEENT:  Pupils equal reactive to light. Extraocular movements are intact. Sclera clear. Palpebral conjunctiva normal red color.Pharynx clear.  NECK: Supple with no palpable mass and no adenopathy.  LUNGS: Sound clear with no rales rhonchi or wheezes.  HEART: Regular rhythm S1 and S2 without murmur.  ABDOMEN: Soft and depressible, nontender with no palpable mass, no hepatomegaly.  Palpable umbilical hernia with tenderness of the patient.  Not completely reducible.  EXTREMITIES: Well-developed well-nourished symmetrical with no dependent edema.  NEUROLOGICAL: Awake alert oriented, facial expression symmetrical, moving all extremities.  I personally evaluated CT scan of the abdomen and pelvis on 10/15/2019.  This shows a small medical hernia with fat-containing tissue.   Assessment & Plan Umbilical hernia without obstruction or gangrene   She is scheduled for umbilical hernia repair with no current pain or discomfort. The plan is to perform an incision under the  umbilicus to access the defect, with an intraoperative decision to use sutures or mesh based on defect size and weakness, though sutures are expected to suffice. The surgery is anticipated to last approximately one hour with same-day discharge. Postoperative care includes instructions on showering, incision care, and pain management. Stronger analgesics are prescribed for potential use on the first night, with advice on managing potential nausea from these medications. She is advised to avoid Mounjaro injection close to the surgery date to prevent anesthesia complications. Postoperative activity, including stair climbing and dietary considerations to prevent constipation, has been discussed.  Constipation   The potential for postoperative constipation, especially with analgesic use, has been discussed. She is advised to maintain regular bowel habits and use fiber, Miralax, or prune juice to prevent constipation. No bowel preparation is required  before surgery unless for personal comfort.   Umbilical hernia without obstruction and without gangrene [K42.9]          Patient verbalized understanding, all questions were answered, and were agreeable with the plan outlined above.   Eldred Grego, MD  Electronically signed by Eldred Grego, MD

## 2023-08-12 ENCOUNTER — Other Ambulatory Visit: Payer: Self-pay

## 2023-08-12 ENCOUNTER — Encounter
Admission: RE | Admit: 2023-08-12 | Discharge: 2023-08-12 | Disposition: A | Discharge status: Home / Self Care | Source: Ambulatory Visit | Attending: General Surgery | Admitting: General Surgery

## 2023-08-12 VITALS — Ht 60.0 in | Wt 151.0 lb

## 2023-08-12 DIAGNOSIS — I10 Essential (primary) hypertension: Secondary | ICD-10-CM

## 2023-08-12 DIAGNOSIS — Z01812 Encounter for preprocedural laboratory examination: Secondary | ICD-10-CM

## 2023-08-12 DIAGNOSIS — E119 Type 2 diabetes mellitus without complications: Secondary | ICD-10-CM

## 2023-08-12 DIAGNOSIS — Z01818 Encounter for other preprocedural examination: Secondary | ICD-10-CM

## 2023-08-12 DIAGNOSIS — Z01810 Encounter for preprocedural cardiovascular examination: Secondary | ICD-10-CM

## 2023-08-12 DIAGNOSIS — E785 Hyperlipidemia, unspecified: Secondary | ICD-10-CM

## 2023-08-12 HISTORY — DX: Hyperlipidemia, unspecified: E78.5

## 2023-08-12 HISTORY — DX: Umbilical hernia without obstruction or gangrene: K42.9

## 2023-08-12 HISTORY — DX: Type 2 diabetes mellitus without complications: E11.9

## 2023-08-12 NOTE — Patient Instructions (Signed)
 Your procedure is scheduled on:08-20-23 Friday Report to the Registration Desk on the 1st floor of the [facility].Then proceed to the 2nd floor Surgery Desk To find out your arrival time, please call [phone] between 1PM - 3PM on:08-19-23 Thursday If your arrival time is 6:00 am, do not arrive before that time as the [facility] entrance doors do not open until 6:00 am.  REMEMBER: Instructions that are not followed completely may result in serious medical risk, up to and including death; or upon the discretion of your surgeon and anesthesiologist your surgery may need to be rescheduled.  Do not eat food OR drink any liquids after midnight the night before surgery.  No gum chewing or hard candies.  One week prior to surgery:Stop NOW (08-12-23) Stop Anti-inflammatories (NSAIDS) such as Advil , Aleve, Ibuprofen , Motrin , Naproxen, Naprosyn and Aspirin based products such as Excedrin, Goody's Powder, BC Powder. Stop ANY OVER THE COUNTER supplements until after surgery.  You may however, continue to take Tylenol  if needed for pain up until the day of surgery.  Stop tirzepatide [facility]) 7 days prior to surgery-Last dose will be today (08-12-23)-Do NOT take again until AFTER surgery  Continue taking all of your other prescription medications up until the day of surgery.  ON THE DAY OF SURGERY ONLY TAKE THESE MEDICATIONS WITH SIPS OF WATER: -rosuvastatin (CRESTOR)   Use your Albuterol  Inhaler the day of surgery and bring your Inhaler to the hospital   No Alcohol for 24 hours before or after surgery.  No Smoking including e-cigarettes for 24 hours before surgery.  No chewable tobacco products for at least 6 hours before surgery.  No nicotine patches on the day of surgery.  Do not use any "recreational" drugs for at least a week (preferably 2 weeks) before your surgery.  Please be advised that the combination of cocaine and anesthesia may have negative outcomes, up to and including  death. If you test positive for cocaine, your surgery will be cancelled.  On the morning of surgery brush your teeth with toothpaste and water, you may rinse your mouth with mouthwash if you wish. Do not swallow any toothpaste or mouthwash.  Use CHG Soap as directed on instruction sheet.  Do not wear jewelry, make-up, hairpins, clips or nail polish.  For welded (permanent) jewelry: bracelets, anklets, waist bands, etc.  Please have this removed prior to surgery.  If it is not removed, there is a chance that hospital personnel will need to cut it off on the day of surgery.  Do not wear lotions, powders, or perfumes.   Do not shave body hair from the neck down 48 hours before surgery.  Contact lenses, hearing aids and dentures may not be worn into surgery.  Do not bring valuables to the hospital. [facility] is not responsible for any missing/lost belongings or valuables.  Notify your doctor if there is any change in your medical condition (cold, fever, infection).  Wear comfortable clothing (specific to your surgery type) to the hospital.  After surgery, you can help prevent lung complications by doing breathing exercises.  Take deep breaths and cough every 1-2 hours. Your doctor may order a device called an Incentive Spirometer to help you take deep breaths. When coughing or sneezing, hold a pillow firmly against your incision with both hands. This is called "splinting." Doing this helps protect your incision. It also decreases belly discomfort.  If you are being admitted to the hospital overnight, leave your suitcase in the car. After  surgery it may be brought to your room.  In case of increased patient census, it may be necessary for you, the patient, to continue your postoperative care in the Same Day Surgery department.  If you are being discharged the day of surgery, you will not be allowed to drive home. You will need a responsible individual to drive you home and stay with you  for 24 hours after surgery.   If you are taking public transportation, you will need to have a responsible individual with you.  Please call the Pre-admissions Testing Dept. at [phone] if you have any questions about these instructions.  Surgery Visitation Policy:  Patients having surgery or a procedure may have two visitors.  Children under the age of 54 must have an adult with them who is not the patient.     Preparing for Surgery with CHLORHEXIDINE GLUCONATE (CHG) Soap  Chlorhexidine Gluconate (CHG) Soap  o An antiseptic cleaner that kills germs and bonds with the skin to continue killing germs even after washing  o Used for showering the night before surgery and morning of surgery  Before surgery, you can play an important role by reducing the number of germs on your skin.  CHG (Chlorhexidine gluconate) soap is an antiseptic cleanser which kills germs and bonds with the skin to continue killing germs even after washing.  Please do not use if you have an allergy to CHG or antibacterial soaps. If your skin becomes reddened/irritated stop using the CHG.  1. Shower the NIGHT BEFORE SURGERY and the MORNING OF SURGERY with CHG soap.  2. If you choose to wash your hair, wash your hair first as usual with your normal shampoo.  3. After shampooing, rinse your hair and body thoroughly to remove the shampoo.  4. Use CHG as you would any other liquid soap. You can apply CHG directly to the skin and wash gently with a scrungie or a clean washcloth.  5. Apply the CHG soap to your body only from the neck down. Do not use on open wounds or open sores. Avoid contact with your eyes, ears, mouth, and genitals (private parts). Wash face and genitals (private parts) with your normal soap.  6. Wash thoroughly, paying special attention to the area where your surgery will be performed.  7. Thoroughly rinse your body with warm water.  8. Do not shower/wash with your normal soap after using  and rinsing off the CHG soap.  9. Pat yourself dry with a clean towel.  10. Wear clean pajamas to bed the night before surgery.  12. Place clean sheets on your bed the night of your first shower and do not sleep with pets.  13. Shower again with the CHG soap on the day of surgery prior to arriving at the hospital.  14. Do not apply any deodorants/lotions/powders.  15. Please wear clean clothes to the hospital.

## 2023-08-16 ENCOUNTER — Other Ambulatory Visit: Payer: Self-pay

## 2023-08-16 ENCOUNTER — Encounter
Admission: RE | Admit: 2023-08-16 | Discharge: 2023-08-16 | Disposition: A | Discharge status: Home / Self Care | Source: Ambulatory Visit | Attending: General Surgery | Admitting: General Surgery

## 2023-08-16 DIAGNOSIS — Z01810 Encounter for preprocedural cardiovascular examination: Secondary | ICD-10-CM | POA: Diagnosis not present

## 2023-08-16 DIAGNOSIS — E785 Hyperlipidemia, unspecified: Secondary | ICD-10-CM | POA: Insufficient documentation

## 2023-08-16 DIAGNOSIS — I10 Essential (primary) hypertension: Secondary | ICD-10-CM | POA: Diagnosis not present

## 2023-08-16 DIAGNOSIS — Z01818 Encounter for other preprocedural examination: Secondary | ICD-10-CM | POA: Insufficient documentation

## 2023-08-16 DIAGNOSIS — E119 Type 2 diabetes mellitus without complications: Secondary | ICD-10-CM | POA: Diagnosis not present

## 2023-08-16 DIAGNOSIS — Z01812 Encounter for preprocedural laboratory examination: Secondary | ICD-10-CM

## 2023-08-16 LAB — CBC
HCT: 37.2 % (ref 36.0–46.0)
Hemoglobin: 12.8 g/dL (ref 12.0–15.0)
MCH: 30 pg (ref 26.0–34.0)
MCHC: 34.4 g/dL (ref 30.0–36.0)
MCV: 87.1 fL (ref 80.0–100.0)
Platelets: 245 10*3/uL (ref 150–400)
RBC: 4.27 MIL/uL (ref 3.87–5.11)
RDW: 11.9 % (ref 11.5–15.5)
WBC: 6.7 10*3/uL (ref 4.0–10.5)
nRBC: 0 % (ref 0.0–0.2)

## 2023-08-16 LAB — BASIC METABOLIC PANEL WITH GFR
Anion gap: 9 (ref 5–15)
BUN: 21 mg/dL — ABNORMAL HIGH (ref 6–20)
CO2: 23 mmol/L (ref 22–32)
Calcium: 8.9 mg/dL (ref 8.9–10.3)
Chloride: 103 mmol/L (ref 98–111)
Creatinine, Ser: 0.65 mg/dL (ref 0.44–1.00)
GFR, Estimated: >60 mL/min (ref >60)
Glucose, Bld: 79 mg/dL (ref 70–99)
Potassium: 4 mmol/L (ref 3.5–5.1)
Sodium: 135 mmol/L (ref 135–145)

## 2023-08-19 MED ORDER — CHLORHEXIDINE GLUCONATE 0.12 % MT SOLN
15.0000 mL | Freq: Once | OROMUCOSAL | Status: AC
  Administered 2023-08-20: 15 mL via OROMUCOSAL

## 2023-08-19 MED ORDER — SODIUM CHLORIDE 0.9 % IV SOLN: INTRAVENOUS | Status: DC

## 2023-08-19 MED ORDER — CEFAZOLIN SODIUM-DEXTROSE 2-4 GM/100ML-% IV SOLN: 2.0000 g | INTRAVENOUS | Status: DC

## 2023-08-19 MED ORDER — ORAL CARE MOUTH RINSE: 15.0000 mL | Freq: Once | OROMUCOSAL | Status: AC

## 2023-08-20 ENCOUNTER — Encounter: Payer: Self-pay | Admitting: General Surgery

## 2023-08-20 ENCOUNTER — Ambulatory Visit
Admission: RE | Admit: 2023-08-20 | Discharge: 2023-08-20 | Disposition: A | Discharge status: Home / Self Care | Payer: Self-pay | Attending: General Surgery | Admitting: General Surgery

## 2023-08-20 ENCOUNTER — Other Ambulatory Visit: Payer: Self-pay

## 2023-08-20 ENCOUNTER — Ambulatory Visit: Payer: Self-pay | Admitting: Urgent Care

## 2023-08-20 ENCOUNTER — Encounter
Admission: RE | Disposition: A | Discharge status: Home / Self Care | Payer: Self-pay | Source: Home / Self Care | Attending: General Surgery

## 2023-08-20 DIAGNOSIS — K429 Umbilical hernia without obstruction or gangrene: Secondary | ICD-10-CM | POA: Insufficient documentation

## 2023-08-20 DIAGNOSIS — J4520 Mild intermittent asthma, uncomplicated: Secondary | ICD-10-CM | POA: Diagnosis not present

## 2023-08-20 DIAGNOSIS — E119 Type 2 diabetes mellitus without complications: Secondary | ICD-10-CM | POA: Insufficient documentation

## 2023-08-20 DIAGNOSIS — I10 Essential (primary) hypertension: Secondary | ICD-10-CM | POA: Diagnosis not present

## 2023-08-20 DIAGNOSIS — Z01818 Encounter for other preprocedural examination: Secondary | ICD-10-CM

## 2023-08-20 DIAGNOSIS — Z7985 Long-term (current) use of injectable non-insulin antidiabetic drugs: Secondary | ICD-10-CM | POA: Diagnosis not present

## 2023-08-20 LAB — GLUCOSE, CAPILLARY
Glucose-Capillary: 65 mg/dL — ABNORMAL LOW (ref 70–99)
Glucose-Capillary: 89 mg/dL (ref 70–99)
Glucose-Capillary: 84 mg/dL (ref 70–99)

## 2023-08-20 LAB — POCT PREGNANCY, URINE: Preg Test, Ur: NEGATIVE

## 2023-08-20 SURGERY — REPAIR, HERNIA, UMBILICAL, ADULT
Anesthesia: General | Site: Abdomen

## 2023-08-20 MED ORDER — HYDROCODONE-ACETAMINOPHEN 5-325 MG PO TABS: 1.0000 | ORAL_TABLET | Freq: Four times a day (QID) | ORAL | 0 refills | Status: AC | PRN

## 2023-08-20 MED ORDER — MIDAZOLAM HCL 2 MG/2ML IJ SOLN
INTRAMUSCULAR | Status: DC | PRN
  Administered 2023-08-20 (×2): 2 mg via INTRAVENOUS

## 2023-08-20 MED ORDER — ROCURONIUM BROMIDE 100 MG/10ML IV SOLN
INTRAVENOUS | Status: DC | PRN
Start: 2023-08-20 — End: 2023-08-20
  Administered 2023-08-20: 50 mg via INTRAVENOUS

## 2023-08-20 MED ORDER — LACTATED RINGERS IV SOLN: INTRAVENOUS | Status: DC | PRN

## 2023-08-20 MED ORDER — LIDOCAINE HCL (PF) 2 % IJ SOLN
INTRAMUSCULAR | Status: AC
  Filled 2023-08-20: qty 5

## 2023-08-20 MED ORDER — DEXAMETHASONE SODIUM PHOSPHATE 10 MG/ML IJ SOLN
INTRAMUSCULAR | Status: DC | PRN
  Administered 2023-08-20: 10 mg via INTRAVENOUS

## 2023-08-20 MED ORDER — FENTANYL CITRATE (PF) 100 MCG/2ML IJ SOLN
INTRAMUSCULAR | Status: AC
  Filled 2023-08-20: qty 2

## 2023-08-20 MED ORDER — CHLORHEXIDINE GLUCONATE 0.12 % MT SOLN
OROMUCOSAL | Status: AC
Start: 2023-08-20 — End: ?
  Filled 2023-08-20: qty 15

## 2023-08-20 MED ORDER — ONDANSETRON HCL 4 MG/2ML IJ SOLN
INTRAMUSCULAR | Status: AC
  Filled 2023-08-20: qty 2

## 2023-08-20 MED ORDER — PHENYLEPHRINE 80 MCG/ML (10ML) SYRINGE FOR IV PUSH (FOR BLOOD PRESSURE SUPPORT)
PREFILLED_SYRINGE | INTRAVENOUS | Status: DC | PRN
  Administered 2023-08-20: 80 ug via INTRAVENOUS

## 2023-08-20 MED ORDER — CEFAZOLIN SODIUM-DEXTROSE 2-4 GM/100ML-% IV SOLN
INTRAVENOUS | Status: AC
  Filled 2023-08-20: qty 100

## 2023-08-20 MED ORDER — DEXTROSE 50 % IV SOLN
INTRAVENOUS | Status: AC
  Filled 2023-08-20: qty 50

## 2023-08-20 MED ORDER — DEXAMETHASONE SODIUM PHOSPHATE 10 MG/ML IJ SOLN
INTRAMUSCULAR | Status: AC
  Filled 2023-08-20: qty 1

## 2023-08-20 MED ORDER — BUPIVACAINE-EPINEPHRINE 0.5% -1:200000 IJ SOLN
INTRAMUSCULAR | Status: DC | PRN
  Administered 2023-08-20: 10 mL

## 2023-08-20 MED ORDER — PROPOFOL 10 MG/ML IV BOLUS
INTRAVENOUS | Status: AC
  Filled 2023-08-20: qty 20

## 2023-08-20 MED ORDER — FENTANYL CITRATE (PF) 100 MCG/2ML IJ SOLN
25.0000 ug | INTRAMUSCULAR | Status: DC | PRN
  Administered 2023-08-20 (×4): 25 ug via INTRAVENOUS

## 2023-08-20 MED ORDER — FENTANYL CITRATE (PF) 100 MCG/2ML IJ SOLN
INTRAMUSCULAR | Status: AC
Start: 2023-08-20 — End: ?
  Filled 2023-08-20: qty 2

## 2023-08-20 MED ORDER — EPINEPHRINE PF 1 MG/ML IJ SOLN
INTRAMUSCULAR | Status: AC
  Filled 2023-08-20: qty 1

## 2023-08-20 MED ORDER — OXYCODONE HCL 5 MG PO TABS
ORAL_TABLET | ORAL | Status: AC
  Filled 2023-08-20: qty 1

## 2023-08-20 MED ORDER — BUPIVACAINE HCL (PF) 0.5 % IJ SOLN
INTRAMUSCULAR | Status: AC
  Filled 2023-08-20: qty 30

## 2023-08-20 MED ORDER — SUGAMMADEX SODIUM 200 MG/2ML IV SOLN
INTRAVENOUS | Status: DC | PRN
  Administered 2023-08-20: 200 mg via INTRAVENOUS

## 2023-08-20 MED ORDER — OXYCODONE HCL 5 MG PO TABS
5.0000 mg | ORAL_TABLET | Freq: Once | ORAL | Status: AC | PRN
  Administered 2023-08-20: 5 mg via ORAL

## 2023-08-20 MED ORDER — OXYCODONE HCL 5 MG/5ML PO SOLN: 5.0000 mg | Freq: Once | ORAL | Status: AC | PRN

## 2023-08-20 MED ORDER — PROPOFOL 10 MG/ML IV BOLUS
INTRAVENOUS | Status: DC | PRN
  Administered 2023-08-20: 150 mg via INTRAVENOUS

## 2023-08-20 MED ORDER — KETOROLAC TROMETHAMINE 30 MG/ML IJ SOLN
INTRAMUSCULAR | Status: DC | PRN
  Administered 2023-08-20: 30 mg via INTRAVENOUS

## 2023-08-20 MED ORDER — MIDAZOLAM HCL 2 MG/2ML IJ SOLN
INTRAMUSCULAR | Status: AC
  Filled 2023-08-20: qty 2

## 2023-08-20 MED ORDER — LIDOCAINE HCL (CARDIAC) PF 100 MG/5ML IV SOSY
PREFILLED_SYRINGE | INTRAVENOUS | Status: DC | PRN
  Administered 2023-08-20: 100 mg via INTRAVENOUS

## 2023-08-20 MED ORDER — FENTANYL CITRATE (PF) 100 MCG/2ML IJ SOLN
INTRAMUSCULAR | Status: DC | PRN
  Administered 2023-08-20 (×2): 50 ug via INTRAVENOUS

## 2023-08-20 MED ORDER — ONDANSETRON HCL 4 MG/2ML IJ SOLN
INTRAMUSCULAR | Status: DC | PRN
  Administered 2023-08-20: 4 mg via INTRAVENOUS

## 2023-08-20 MED ORDER — PROPOFOL 10 MG/ML IV BOLUS
INTRAVENOUS | Status: AC
Start: 2023-08-20 — End: ?
  Filled 2023-08-20: qty 20

## 2023-08-20 MED ORDER — DROPERIDOL 2.5 MG/ML IJ SOLN
INTRAMUSCULAR | Status: AC
  Filled 2023-08-20: qty 2

## 2023-08-20 MED ORDER — DEXTROSE 50 % IV SOLN
25.0000 mL | Freq: Once | INTRAVENOUS | Status: AC
Start: 2023-08-20 — End: 2023-08-20
  Administered 2023-08-20: 25 mL via INTRAVENOUS

## 2023-08-20 MED ORDER — DROPERIDOL 2.5 MG/ML IJ SOLN
0.6250 mg | Freq: Once | INTRAMUSCULAR | Status: AC
  Administered 2023-08-20: 0.625 mg via INTRAVENOUS

## 2023-08-20 SURGICAL SUPPLY — 30 items
BLADE SURG 15 STRL LF DISP TIS (BLADE) ×1 IMPLANT
CHLORAPREP W/TINT 26 (MISCELLANEOUS) IMPLANT
DERMABOND ADVANCED .7 DNX12 (GAUZE/BANDAGES/DRESSINGS) IMPLANT
DRAPE LAPAROTOMY 100X77 ABD (DRAPES) ×1 IMPLANT
DRSG OPSITE POSTOP 4X10 (GAUZE/BANDAGES/DRESSINGS) IMPLANT
DRSG OPSITE POSTOP 4X8 (GAUZE/BANDAGES/DRESSINGS) IMPLANT
ELECTRODE EZSTD 165MM 6.5IN (MISCELLANEOUS) IMPLANT
ELECTRODE REM PT RTRN 9FT ADLT (ELECTROSURGICAL) ×1 IMPLANT
GLOVE BIO SURGEON STRL SZ 6.5 (GLOVE) ×1 IMPLANT
GLOVE BIOGEL PI IND STRL 6.5 (GLOVE) ×1 IMPLANT
GLOVE SURG SYN 6.5 ES PF (GLOVE) IMPLANT
GLOVE SURG SYN 6.5 PF PI (GLOVE) ×2 IMPLANT
GOWN STRL REUS W/ TWL LRG LVL3 (GOWN DISPOSABLE) ×2 IMPLANT
KIT TURNOVER KIT A (KITS) ×1 IMPLANT
LABEL OR SOLS (LABEL) ×1 IMPLANT
MANIFOLD NEPTUNE II (INSTRUMENTS) ×1 IMPLANT
NDL HYPO 22X1.5 SAFETY MO (MISCELLANEOUS) ×1 IMPLANT
NEEDLE HYPO 22X1.5 SAFETY MO (MISCELLANEOUS) ×1 IMPLANT
NS IRRIG 500ML POUR BTL (IV SOLUTION) ×1 IMPLANT
PACK BASIN MINOR ARMC (MISCELLANEOUS) ×1 IMPLANT
SPONGE T-LAP 18X18 ~~LOC~~+RFID (SPONGE) ×1 IMPLANT
STAPLER SKIN PROX 35W (STAPLE) IMPLANT
SUT ETHIBOND 0 (SUTURE) IMPLANT
SUT PDS PLUS AB 0 CT-2 (SUTURE) ×1 IMPLANT
SUT PROLENE 0 CT 2 (SUTURE) ×2 IMPLANT
SUT VIC AB 2-0 SH 27XBRD (SUTURE) ×1 IMPLANT
SUTURE MNCRL 4-0 27XMF (SUTURE) ×1 IMPLANT
SYR 10ML LL (SYRINGE) ×1 IMPLANT
TRAP FLUID SMOKE EVACUATOR (MISCELLANEOUS) ×1 IMPLANT
WATER STERILE IRR 500ML POUR (IV SOLUTION) ×1 IMPLANT

## 2023-08-20 NOTE — Discharge Instructions (Addendum)

## 2023-08-20 NOTE — Interval H&P Note (Signed)
 History and Physical Interval Note:  08/20/2023 10:26 AM  Whitney Palmer  has presented today for surgery, with the diagnosis of K42.9 umbilical hernia w/o obstruction or gangrene.  The various methods of treatment have been discussed with the patient and family. After consideration of risks, benefits and other options for treatment, the patient has consented to  Procedure(s): REPAIR, HERNIA, UMBILICAL, ADULT (N/A) as a surgical intervention.  The patient's history has been reviewed, patient examined, no change in status, stable for surgery.  I have reviewed the patient's chart and labs.  Questions were answered to the patient's satisfaction.     Eldred Grego

## 2023-08-20 NOTE — Op Note (Signed)
 Preoperative diagnosis: Umbilical hernia.   Postoperative diagnosis: Umbilical hernia.   Procedure: Umbilical hernia repair  Anesthesia: GETA   Surgeon: Dr. Dortha Gauss   Wound Classification: Clean   Indications: Patient is a 53 y.o. female developed a symptomatic umbilical hernia. This was symptomatic and repair was indicated.    Findings: 1. A 1 cm umbilical hernia  2. No hollow viscus organ injury identified during procedure 3. Tension free repair achieved 4. Adequate hemostasis   Description of procedure: The patient was brought to the operating room and general anesthesia was induced. A time-out was completed verifying correct patient, procedure, site, positioning, and implant(s) and/or special equipment prior to beginning this procedure. Antibiotics were administered prior to making the incision. The anterior abdominal wall was prepped and draped in the standard sterile fashion. A infra umbilical incision was made. The incision was deepened to the fascia. The hernia sac was then identified and dissected free. The peritoneum of the sac was dissected from the umbilical stalk and able to be reduced easily. The fascia was carefully palpated no additional defects were identified. The hernia defect was closed with interrupted 0 Ethibond suture on midline. The umbilical stalk was fixed to fascia with 2-0 Vicryl and the skin was closed with subcuticular sutures of Monocryl 4-0. Wound covered with Dermabond.   The patient tolerated the procedure well and was brought to the postanesthesia care unit in stable condition.    Specimen: None   Complications: None   Estimated Blood Loss: 5 mL

## 2023-08-20 NOTE — Anesthesia Postprocedure Evaluation (Signed)
 Anesthesia Post Note  Patient: Whitney Palmer  Procedure(s) Performed: REPAIR, HERNIA, UMBILICAL, ADULT (Abdomen)  Patient location during evaluation: PACU Anesthesia Type: General Level of consciousness: awake and alert Pain management: pain level controlled Vital Signs Assessment: post-procedure vital signs reviewed and stable Respiratory status: spontaneous breathing, nonlabored ventilation, respiratory function stable and patient connected to nasal cannula oxygen Cardiovascular status: blood pressure returned to baseline and stable Postop Assessment: no apparent nausea or vomiting Anesthetic complications: no  No notable events documented.   Last Vitals:  Vitals:   08/20/23 1245 08/20/23 1308  BP: (!) 94/53 118/79  Pulse: 79 88  Resp: (!) 0 18  Temp:  36.8 C  SpO2: 98% 100%    Last Pain:  Vitals:   08/20/23 1308  TempSrc: Temporal  PainSc: 3                  Enrique Harvest

## 2023-08-20 NOTE — Transfer of Care (Signed)
 Immediate Anesthesia Transfer of Care Note  Patient: Whitney Palmer  Procedure(s) Performed: REPAIR, HERNIA, UMBILICAL, ADULT (Abdomen)  Patient Location: PACU  Anesthesia Type:General  Level of Consciousness: drowsy  Airway & Oxygen Therapy: Patient Spontanous Breathing and Patient connected to face mask oxygen  Post-op Assessment: Report given to RN and Post -op Vital signs reviewed and stable  Post vital signs: Reviewed and stable  Last Vitals:  Vitals Value Taken Time  BP 122/70 08/20/23 1200  Temp    Pulse 85 08/20/23 1200  Resp 3 08/20/23 1200  SpO2 96 % 08/20/23 1200  Vitals shown include unfiled device data.  Last Pain:  Vitals:   08/20/23 0851  TempSrc: Temporal  PainSc: 0-No pain         Complications: No notable events documented.

## 2023-08-20 NOTE — Anesthesia Procedure Notes (Signed)
 Procedure Name: Intubation Date/Time: 08/20/2023 11:02 AM  Performed by: Ezzard Holms, CRNAPre-anesthesia Checklist: Patient identified, Patient being monitored, Timeout performed, Emergency Drugs available and Suction available Patient Re-evaluated:Patient Re-evaluated prior to induction Oxygen Delivery Method: Circle system utilized Preoxygenation: Pre-oxygenation with 100% oxygen Induction Type: IV induction Ventilation: Mask ventilation without difficulty Laryngoscope Size: Mac and 3 Grade View: Grade I Tube type: Oral Tube size: 7.5 mm Number of attempts: 1 Airway Equipment and Method: Stylet Placement Confirmation: ETT inserted through vocal cords under direct vision, positive ETCO2 and breath sounds checked- equal and bilateral Secured at: 21 cm Tube secured with: Tape Dental Injury: Teeth and Oropharynx as per pre-operative assessment

## 2023-08-20 NOTE — Anesthesia Preprocedure Evaluation (Signed)
 Anesthesia Evaluation  Patient identified by MRN, date of birth, ID band Patient awake    Reviewed: Allergy & Precautions, NPO status , Patient's Chart, lab work & pertinent test results  Airway Mallampati: III  TM Distance: >3 FB Neck ROM: full    Dental  (+) Chipped   Pulmonary asthma    Pulmonary exam normal        Cardiovascular hypertension, negative cardio ROS Normal cardiovascular exam     Neuro/Psych negative neurological ROS  negative psych ROS   GI/Hepatic negative GI ROS, Neg liver ROS,,,  Endo/Other  negative endocrine ROSdiabetes    Renal/GU negative Renal ROS  negative genitourinary   Musculoskeletal   Abdominal   Peds  Hematology negative hematology ROS (+)   Anesthesia Other Findings Past Medical History: No date: Asthma     Comment:  well controlled No date: Chronic bronchitis (HCC) No date: DM (diabetes mellitus), type 2 (HCC) No date: Hay fever No date: Hyperlipidemia No date: Hypertension No date: Umbilical hernia  Past Surgical History: No date: NO PAST SURGERIES  BMI    Body Mass Index: 28.90 kg/m      Reproductive/Obstetrics negative OB ROS                             Anesthesia Physical Anesthesia Plan  ASA: 2  Anesthesia Plan: General ETT   Post-op Pain Management:    Induction: Intravenous  PONV Risk Score and Plan: Ondansetron , Dexamethasone and Midazolam  Airway Management Planned: Oral ETT  Additional Equipment:   Intra-op Plan:   Post-operative Plan: Extubation in OR  Informed Consent: I have reviewed the patients History and Physical, chart, labs and discussed the procedure including the risks, benefits and alternatives for the proposed anesthesia with the patient or authorized representative who has indicated his/her understanding and acceptance.     Dental Advisory Given  Plan Discussed with: Anesthesiologist, CRNA and  Surgeon  Anesthesia Plan Comments: (Patient consented for risks of anesthesia including but not limited to:  - adverse reactions to medications - damage to eyes, teeth, lips or other oral mucosa - nerve damage due to positioning  - sore throat or hoarseness - Damage to heart, brain, nerves, lungs, other parts of body or loss of life  Patient voiced understanding and assent.)       Anesthesia Quick Evaluation

## 2023-08-21 ENCOUNTER — Encounter: Payer: Self-pay | Admitting: General Surgery

## 2023-08-24 LAB — SURGICAL PATHOLOGY

## 2023-09-17 ENCOUNTER — Encounter: Payer: Self-pay | Admitting: General Surgery
